# Patient Record
Sex: Female | Born: 1980 | Race: White | Hispanic: No | Smoking: Current some day smoker
Health system: Southern US, Community
[De-identification: ages and names within clinical notes are randomized; demographics above are authoritative.]

## PROBLEM LIST (undated history)

## (undated) ENCOUNTER — Inpatient Hospital Stay (HOSPITAL_COMMUNITY): Payer: Self-pay

## (undated) DIAGNOSIS — I10 Essential (primary) hypertension: Secondary | ICD-10-CM

## (undated) DIAGNOSIS — F419 Anxiety disorder, unspecified: Secondary | ICD-10-CM

## (undated) DIAGNOSIS — F988 Other specified behavioral and emotional disorders with onset usually occurring in childhood and adolescence: Secondary | ICD-10-CM

## (undated) DIAGNOSIS — F32A Depression, unspecified: Secondary | ICD-10-CM

## (undated) DIAGNOSIS — F329 Major depressive disorder, single episode, unspecified: Secondary | ICD-10-CM

## (undated) HISTORY — DX: Anxiety disorder, unspecified: F41.9

## (undated) HISTORY — PX: NO PAST SURGERIES: SHX2092

## (undated) HISTORY — DX: Other specified behavioral and emotional disorders with onset usually occurring in childhood and adolescence: F98.8

---

## 2012-09-04 ENCOUNTER — Encounter (HOSPITAL_COMMUNITY): Payer: Self-pay | Admitting: Emergency Medicine

## 2012-09-04 ENCOUNTER — Emergency Department (INDEPENDENT_AMBULATORY_CARE_PROVIDER_SITE_OTHER)
Admission: EM | Admit: 2012-09-04 | Discharge: 2012-09-04 | Disposition: A | Discharge status: Home / Self Care | Payer: Self-pay | Source: Home / Self Care | Attending: Emergency Medicine | Admitting: Emergency Medicine

## 2012-09-04 DIAGNOSIS — S161XXA Strain of muscle, fascia and tendon at neck level, initial encounter: Secondary | ICD-10-CM

## 2012-09-04 DIAGNOSIS — S139XXA Sprain of joints and ligaments of unspecified parts of neck, initial encounter: Secondary | ICD-10-CM

## 2012-09-04 HISTORY — DX: Essential (primary) hypertension: I10

## 2012-09-04 HISTORY — DX: Other specified behavioral and emotional disorders with onset usually occurring in childhood and adolescence: F98.8

## 2012-09-04 HISTORY — DX: Depression, unspecified: F32.A

## 2012-09-04 HISTORY — DX: Major depressive disorder, single episode, unspecified: F32.9

## 2012-09-04 MED ORDER — IBUPROFEN 800 MG PO TABS: 800.0000 mg | ORAL_TABLET | Freq: Three times a day (TID) | ORAL | Status: DC

## 2012-09-04 MED ORDER — CYCLOBENZAPRINE HCL 5 MG PO TABS: 5.0000 mg | ORAL_TABLET | Freq: Three times a day (TID) | ORAL | Status: DC | PRN

## 2012-09-04 MED ORDER — HYDROCODONE-ACETAMINOPHEN 5-325 MG PO TABS: ORAL_TABLET | ORAL | Status: DC
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
## 2013-01-08 ENCOUNTER — Other Ambulatory Visit: Payer: Self-pay | Admitting: Family Medicine

## 2013-01-08 ENCOUNTER — Other Ambulatory Visit (HOSPITAL_COMMUNITY)
Admission: RE | Admit: 2013-01-08 | Discharge: 2013-01-08 | Disposition: A | Discharge status: Home / Self Care | Payer: Self-pay | Source: Ambulatory Visit | Attending: Family Medicine | Admitting: Family Medicine

## 2013-01-08 DIAGNOSIS — Z0000 Encounter for general adult medical examination without abnormal findings: Secondary | ICD-10-CM | POA: Insufficient documentation

## 2015-06-21 ENCOUNTER — Inpatient Hospital Stay (HOSPITAL_COMMUNITY): Payer: 59

## 2015-06-21 ENCOUNTER — Encounter (HOSPITAL_COMMUNITY): Payer: Self-pay | Admitting: *Deleted

## 2015-06-21 ENCOUNTER — Inpatient Hospital Stay (HOSPITAL_COMMUNITY)
Admission: AD | Admit: 2015-06-21 | Discharge: 2015-06-21 | Disposition: A | Discharge status: Home / Self Care | Payer: 59 | Source: Ambulatory Visit | Attending: Obstetrics & Gynecology | Admitting: Obstetrics & Gynecology

## 2015-06-21 DIAGNOSIS — F329 Major depressive disorder, single episode, unspecified: Secondary | ICD-10-CM | POA: Insufficient documentation

## 2015-06-21 DIAGNOSIS — R109 Unspecified abdominal pain: Secondary | ICD-10-CM | POA: Insufficient documentation

## 2015-06-21 DIAGNOSIS — O9989 Other specified diseases and conditions complicating pregnancy, childbirth and the puerperium: Secondary | ICD-10-CM

## 2015-06-21 DIAGNOSIS — O26891 Other specified pregnancy related conditions, first trimester: Secondary | ICD-10-CM | POA: Insufficient documentation

## 2015-06-21 DIAGNOSIS — O200 Threatened abortion: Secondary | ICD-10-CM | POA: Insufficient documentation

## 2015-06-21 DIAGNOSIS — O99341 Other mental disorders complicating pregnancy, first trimester: Secondary | ICD-10-CM | POA: Diagnosis not present

## 2015-06-21 DIAGNOSIS — Z3A01 Less than 8 weeks gestation of pregnancy: Secondary | ICD-10-CM | POA: Insufficient documentation

## 2015-06-21 DIAGNOSIS — O3680X0 Pregnancy with inconclusive fetal viability, not applicable or unspecified: Secondary | ICD-10-CM

## 2015-06-21 DIAGNOSIS — O10911 Unspecified pre-existing hypertension complicating pregnancy, first trimester: Secondary | ICD-10-CM | POA: Insufficient documentation

## 2015-06-21 DIAGNOSIS — F909 Attention-deficit hyperactivity disorder, unspecified type: Secondary | ICD-10-CM | POA: Insufficient documentation

## 2015-06-21 DIAGNOSIS — O26899 Other specified pregnancy related conditions, unspecified trimester: Secondary | ICD-10-CM

## 2015-06-21 LAB — URINALYSIS, ROUTINE W REFLEX MICROSCOPIC
Bilirubin Urine: NEGATIVE
GLUCOSE, UA: NEGATIVE mg/dL
Ketones, ur: NEGATIVE mg/dL
LEUKOCYTES UA: NEGATIVE
NITRITE: NEGATIVE
PH: 6 (ref 5.0–8.0)
Protein, ur: NEGATIVE mg/dL
SPECIFIC GRAVITY, URINE: 1.02 (ref 1.005–1.030)

## 2015-06-21 LAB — CBC
HEMATOCRIT: 39.8 % (ref 36.0–46.0)
HEMOGLOBIN: 13.4 g/dL (ref 12.0–15.0)
MCH: 32.3 pg (ref 26.0–34.0)
MCHC: 33.7 g/dL (ref 30.0–36.0)
MCV: 95.9 fL (ref 78.0–100.0)
Platelets: 440 10*3/uL — ABNORMAL HIGH (ref 150–400)
RBC: 4.15 MIL/uL (ref 3.87–5.11)
RDW: 13.5 % (ref 11.5–15.5)
WBC: 18.4 10*3/uL — AB (ref 4.0–10.5)

## 2015-06-21 LAB — URINE MICROSCOPIC-ADD ON

## 2015-06-21 LAB — WET PREP, GENITAL
Sperm: NONE SEEN
TRICH WET PREP: NONE SEEN
Yeast Wet Prep HPF POC: NONE SEEN

## 2015-06-21 LAB — ABO/RH: ABO/RH(D): O POS

## 2015-06-21 LAB — HCG, QUANTITATIVE, PREGNANCY: hCG, Beta Chain, Quant, S: 1969 m[IU]/mL — ABNORMAL HIGH (ref <5)

## 2015-06-21 NOTE — Discharge Summary (Signed)
DATE: 06/21/2015  Maternity Admissions Unit DC Summary  Admitted: 06/21/15 Discharged: 06/21/15  Ms. Loretta Brown is a 35 y.o. female, G2P0010, at [redacted]w[redacted]d gestation, who presents for management of a threatened miscarriage. The patient has been followed at [facility]. She has had spotting. She presents today complaining of lower abdominal pain that she rates at 4 out of 10. She has had quantitative hCG values checked in her doctor's office. She was told that the test are not normal. She does not know what the test results are however. She has not had an ultrasound done. She is very upset because she had a miscarriage one year ago.  OB History    Gravida Para Term Preterm AB TAB SAB Ectopic Multiple Living   2    1  1    0      Past Medical History  Diagnosis Date  . Depression   . Attention deficit disorder   . Hypertension     Prescriptions prior to admission  Medication Sig Dispense Refill Last Dose  . amphetamine-dextroamphetamine (ADDERALL) 20 MG tablet Take 20 mg by mouth 2 (two) times daily.   06/20/2015 at Unknown time  . buPROPion (WELLBUTRIN XL) 300 MG 24 hr tablet Take 300 mg by mouth daily.   06/20/2015 at Unknown time  . DULoxetine HCl (CYMBALTA PO) Take 1 tablet by mouth daily.   06/20/2015 at Unknown time  . glycopyrrolate (ROBINUL) 1 MG tablet Take 0.5-1 mg by mouth 2 (two) times daily.   06/20/2015 at Unknown time  . hydrALAZINE (APRESOLINE) 25 MG tablet Take 25 mg by mouth 2 (two) times daily.   06/20/2015 at Unknown time  . hydrochlorothiazide (HYDRODIURIL) 25 MG tablet Take 25 mg by mouth daily.   06/20/2015 at Unknown time  . labetalol (NORMODYNE) 100 MG tablet Take 100 mg by mouth 2 (two) times daily.   06/20/2015 at 8:00am  . cyclobenzaprine (FLEXERIL) 5 MG tablet Take 1 tablet (5 mg total) by mouth 3 (three) times daily as needed for muscle spasms. (Patient not taking: Reported on  06/21/2015) 30 tablet 0   . HYDROcodone-acetaminophen (NORCO/VICODIN) 5-325 MG per tablet 1 to 2 tabs every 4 to 6 hours as needed for pain. (Patient not taking: Reported on 06/21/2015) 20 tablet 0   . ibuprofen (ADVIL,MOTRIN) 800 MG tablet Take 1 tablet (800 mg total) by mouth 3 (three) times daily. (Patient not taking: Reported on 06/21/2015) 21 tablet 0     Past Surgical History  Procedure Laterality Date  . No past surgeries      No Known Allergies  Family History: family history is not on file.  Social History:  reports that she has been smoking. She does not have any smokeless tobacco history on file. She reports that she drinks alcohol. She reports that she uses illicit drugs (Marijuana).  Review of systems: Normal pregnancy complaints.  Admission Physical Exam:    Body mass index is 33.56 kg/(m^2).  Blood pressure 146/104, pulse 69, temperature 98 F (36.7 C), temperature source Oral, resp. rate 18, height 5\' 4"  (1.626 m), weight 195 lb 9.6 oz (88.724 kg), last menstrual period 06/01/2015.  HEENT: Within normal limits Chest: Clear Heart: Regular rate and rhythm Abdomen: Gravid and nontender Extremities: Grossly normal Neurologic exam: Grossly normal Pelvic exam:   External genitalia: Within normal limits Vagina: Within normal limits. No blood. Cervix: Closed. Nontender Uterus: Normal size, nontender Adnexa: No masses  Prenatal labs: ABO, Rh: --/--/O POS (03/26 [id number])  Assessment:  [redacted]w[redacted]d  gestation  Threatened miscarriage  Abdominal pain  Hypertension  Depression  ADHD  Plan:  Quantitative hCG sent today.  Ultrasound ordered.  GC and Chlamydia sent.  We discussed the management of a failed pregnancy. The patient will remain nothing by mouth for now. Further management will depend upon her test results.   Lab Results Last 24 Hours          MAU Course:  The patient was able to get HCG values from her medical records:  06/16/15 [facility] 4231  06/18/15  QHCG 3519  US FINDINGS:  Intrauterine gestational sac: Irregular sac-like density within the endometrial canal measuring approximately 1 cm.  Yolk sac: Not identified  Embryo: Not identified  MSD: 10 mm 5 w 5 d  Maternal uterus/adnexae: The RIGHT ovary is identified and normal in size and echogenicity. The LEFT ovary is less well-defined. LEFT ovary is not hyperemic. There is moderate volume complex fluid in the LEFT adnexa as well as posterior cul-de-sac fine with fine shading suggesting small amount of blood within the fluid.  IMPRESSION: Irregular sac-like structure within the endometrium with moderate volume complex fluid posterior in cul-de-sac and LEFT adnexal fluid. Differential consideration would be spontaneous abortion in complex versus occult ectopic pregnancy. Early pregnancy is less favored but cannot be excluded.  CBC    Component Value Date/Time   WBC 18.4* 06/21/2015 0914   RBC 4.15 06/21/2015 0914   HGB 13.4 06/21/2015 0914   HCT 39.8 06/21/2015 0914   PLT 440* 06/21/2015 0914   MCV 95.9 06/21/2015 0914   MCH 32.3 06/21/2015 0914   MCHC 33.7 06/21/2015 0914   RDW 13.5 06/21/2015 0914    QHCG = 1969  Management options reviewed including observation with repeat US and HCGs, medical management, and D and C. Risks and benefits discussed. The patient and her husband want to go home and discuss with Dr. Nelda Marseille tomorrow. Ectopic precautions were given.     Michiah Mudry V 06/21/2015, 11:43 AM

## 2015-06-21 NOTE — MAU Note (Signed)
Pos HPT last week, had BHCG last Tuesday & Thursday @ MD office, BHCG not rising appropriately, was told she may have possible ectopic.  Spotting for the past 2 days, started having abdominal pain @ 0700 today, is getting progressively worse.

## 2015-06-21 NOTE — MAU Provider Note (Signed)
History     CSN: [id number]  Arrival date and time: 06/21/15 [id number]    First Provider Initiated Contact with Patient 06/21/15 [phone]      Chief Complaint  Patient presents with  . Abdominal Pain   HPI  Loretta Brown is a 35 y.o. G3P0011 at [redacted]w[redacted]d who presents by LMP who presents for abdominal pain & vaginal spotting. BHCG being followed by Dr. Nelda Marseille. Had labs drawn twice last week & told they weren't rising appropriately; doesn't know what the levels were & has not had an ultrasound yet.  Reports increased lower abdominal cramping this morning. Vaginal spotting off & on.  Abdominal pain is menstrual-like cramps. Rates pain 8/10.   OB History    Gravida Para Term Preterm AB TAB SAB Ectopic Multiple Living   1               Past Medical History  Diagnosis Date  . Depression   . Attention deficit disorder   . Hypertension     No past surgical history on file.  No family history on file.  Social History  Substance Use Topics  . Smoking status: Current Every Day Smoker  . Smokeless tobacco: Not on file  . Alcohol Use: Yes    Allergies: No Known Allergies  Prescriptions prior to admission  Medication Sig Dispense Refill Last Dose  . amphetamine-dextroamphetamine (ADDERALL XR) 30 MG 24 hr capsule Take 30 mg by mouth every morning.     Marland Kitchen buPROPion (WELLBUTRIN SR) 150 MG 12 hr tablet Take 150 mg by mouth 2 (two) times daily.     . cyclobenzaprine (FLEXERIL) 5 MG tablet Take 1 tablet (5 mg total) by mouth 3 (three) times daily as needed for muscle spasms. 30 tablet 0   . HYDROcodone-acetaminophen (NORCO/VICODIN) 5-325 MG per tablet 1 to 2 tabs every 4 to 6 hours as needed for pain. 20 tablet 0   . ibuprofen (ADVIL,MOTRIN) 800 MG tablet Take 1 tablet (800 mg total) by mouth 3 (three) times daily. 21 tablet 0   . sertraline (ZOLOFT) 50 MG tablet Take 50 mg by mouth daily.       Review of Systems  Constitutional: Negative.   Gastrointestinal: Positive for abdominal pain.  Negative for nausea, vomiting, diarrhea and constipation.  Genitourinary: Negative for dysuria.       + vaginal spotting   Physical Exam   Blood pressure 146/104, pulse 69, temperature 98 F (36.7 C), temperature source Oral, resp. rate 18, height 5\' 4"  (1.626 m), weight 195 lb 9.6 oz (88.724 kg), last menstrual period 06/01/2015.  Physical Exam  Nursing note and vitals reviewed. Constitutional: She is oriented to person, place, and time. She appears well-developed and well-nourished. No distress.  HENT:  Head: Normocephalic and atraumatic.  Eyes: Conjunctivae are normal. Right eye exhibits no discharge. Left eye exhibits no discharge. No scleral icterus.  Neck: Normal range of motion.  Cardiovascular:  No murmur heard. Respiratory: Effort normal. No respiratory distress.  Neurological: She is alert and oriented to person, place, and time.  Skin: Skin is warm and dry. She is not diaphoretic.  Psychiatric: She has a normal mood and affect. Her behavior is normal. Judgment and thought content normal.    MAU Course  Procedures Results for orders placed or performed during the hospital encounter of 06/21/15 (from the past 24 hour(s))  hCG, quantitative, pregnancy     Status: Abnormal   Collection Time: 06/21/15  8:42 AM  Result Value Ref Range  hCG, Beta Chain, Quant, S 1969 (H) <5 mIU/mL  CBC     Status: Abnormal   Collection Time: 06/21/15  9:14 AM  Result Value Ref Range   WBC 18.4 (H) 4.0 - 10.5 K/uL   RBC 4.15 3.87 - 5.11 MIL/uL   Hemoglobin 13.4 12.0 - 15.0 g/dL   HCT 39.8 36.0 - 46.0 %   MCV 95.9 78.0 - 100.0 fL   MCH 32.3 26.0 - 34.0 pg   MCHC 33.7 30.0 - 36.0 g/dL   RDW 13.5 11.5 - 15.5 %   Platelets 440 (H) 150 - 400 K/uL  ABO/Rh     Status: None (Preliminary result)   Collection Time: 06/21/15  9:14 AM  Result Value Ref Range   ABO/RH(D) O POS   Urinalysis, Routine w reflex microscopic (not at [facility])     Status: Abnormal   Collection Time: 06/21/15  9:15 AM   Result Value Ref Range   Color, Urine YELLOW YELLOW   APPearance CLEAR CLEAR   Specific Gravity, Urine 1.020 1.005 - 1.030   pH 6.0 5.0 - 8.0   Glucose, UA NEGATIVE NEGATIVE mg/dL   Hgb urine dipstick MODERATE (A) NEGATIVE   Bilirubin Urine NEGATIVE NEGATIVE   Ketones, ur NEGATIVE NEGATIVE mg/dL   Protein, ur NEGATIVE NEGATIVE mg/dL   Nitrite NEGATIVE NEGATIVE   Leukocytes, UA NEGATIVE NEGATIVE  Urine microscopic-add on     Status: Abnormal   Collection Time: 06/21/15  9:15 AM  Result Value Ref Range   Squamous Epithelial / LPF 0-5 (A) NONE SEEN   WBC, UA 0-5 0 - 5 WBC/hpf   RBC / HPF 0-5 0 - 5 RBC/hpf   Bacteria, UA FEW (A) NONE SEEN  Wet prep, genital     Status: Abnormal   Collection Time: 06/21/15  9:54 AM  Result Value Ref Range   Yeast Wet Prep HPF POC NONE SEEN NONE SEEN   Trich, Wet Prep NONE SEEN NONE SEEN   Clue Cells Wet Prep HPF POC PRESENT (A) NONE SEEN   WBC, Wet Prep HPF POC FEW (A) NONE SEEN   Sperm NONE SEEN    US Ob Comp Less 14 Wks  06/21/2015  CLINICAL DATA:  Estimated gestational age by last menstrual. Approximately 5 to 6 weeks. Positive pregnancy test with beta HCG equal 1969. EXAM: OBSTETRIC <14 WK [place] AND TRANSVAGINAL OB US TECHNIQUE: Both transabdominal and transvaginal ultrasound examinations were performed for complete evaluation of the gestation as well as the maternal uterus, adnexal regions, and pelvic cul-de-sac. Transvaginal technique was performed to assess early pregnancy. COMPARISON:  None. FINDINGS: Intrauterine gestational sac: Irregular sac-like density within the endometrial canal measuring approximately 1 cm. Yolk sac:  Not identified Embryo:  Not identified MSD: 10  mm   5 w   5  d Maternal uterus/adnexae: The RIGHT ovary is identified and normal in size and echogenicity. The LEFT ovary is less well-defined. LEFT ovary is not hyperemic. There is moderate volume complex fluid in the LEFT adnexa as well as posterior cul-de-sac fine with fine  shading suggesting small amount of blood within the fluid. IMPRESSION: Irregular sac-like structure within the endometrium with moderate volume complex fluid posterior in cul-de-sac and LEFT adnexal fluid. Differential consideration would be spontaneous abortion in complex versus occult ectopic pregnancy. Early pregnancy is less favored but cannot be excluded. Findings conveyed toERIN Briauna Gilmartin on 06/21/2015  at11:10. Electronically Signed   By: Suzy Bouchard M.D.   On: 06/21/2015 11:12   US  Ob Transvaginal  06/21/2015  CLINICAL DATA:  Estimated gestational age by last menstrual. Approximately 5 to 6 weeks. Positive pregnancy test with beta HCG equal 1969. EXAM: OBSTETRIC <14 WK [place] AND TRANSVAGINAL OB US TECHNIQUE: Both transabdominal and transvaginal ultrasound examinations were performed for complete evaluation of the gestation as well as the maternal uterus, adnexal regions, and pelvic cul-de-sac. Transvaginal technique was performed to assess early pregnancy. COMPARISON:  None. FINDINGS: Intrauterine gestational sac: Irregular sac-like density within the endometrial canal measuring approximately 1 cm. Yolk sac:  Not identified Embryo:  Not identified MSD: 10  mm   5 w   5  d Maternal uterus/adnexae: The RIGHT ovary is identified and normal in size and echogenicity. The LEFT ovary is less well-defined. LEFT ovary is not hyperemic. There is moderate volume complex fluid in the LEFT adnexa as well as posterior cul-de-sac fine with fine shading suggesting small amount of blood within the fluid. IMPRESSION: Irregular sac-like structure within the endometrium with moderate volume complex fluid posterior in cul-de-sac and LEFT adnexal fluid. Differential consideration would be spontaneous abortion in complex versus occult ectopic pregnancy. Early pregnancy is less favored but cannot be excluded. Findings conveyed toERIN Gwendalyn Mcgonagle on 06/21/2015  at11:10. Electronically Signed   By: Suzy Bouchard M.D.   On:  06/21/2015 11:12    MDM CBC, abo/rh, BHCG, ultrasound O positive Dr. Raphael Gibney at bedside for pelvic exam.  Discussed ultrasound report with Dr. Raphael Gibney. Will come speak with patient regarding results & POC.   Jorje Guild, NP  06/21/2015 11:44 AM  Assessment and Plan

## 2015-06-21 NOTE — MAU Note (Signed)
DATE: 06/21/2015  Maternity Admissions Unit History and Physical Exam for an Obstetrics Patient  Ms. Loretta Brown is a 35 y.o. female, G2P0010, at [redacted]w[redacted]d gestation, who presents for management of a threatened miscarriage. The patient has been followed at [facility]. She has had spotting. She presents today complaining of lower abdominal pain that she rates at 4 out of 10. She has had quantitative hCG values checked in her doctor's office. She was told that the test are not normal. She does not know what the test results are however. She has not had an ultrasound done. She is very upset because she had a miscarriage one year ago.  OB History    Gravida Para Term Preterm AB TAB SAB Ectopic Multiple Living   2    1  1    0      Past Medical History  Diagnosis Date  . Depression   . Attention deficit disorder   . Hypertension     Prescriptions prior to admission  Medication Sig Dispense Refill Last Dose  . amphetamine-dextroamphetamine (ADDERALL) 20 MG tablet Take 20 mg by mouth 2 (two) times daily.   06/20/2015 at Unknown time  . buPROPion (WELLBUTRIN XL) 300 MG 24 hr tablet Take 300 mg by mouth daily.   06/20/2015 at Unknown time  . DULoxetine HCl (CYMBALTA PO) Take 1 tablet by mouth daily.   06/20/2015 at Unknown time  . glycopyrrolate (ROBINUL) 1 MG tablet Take 0.5-1 mg by mouth 2 (two) times daily.   06/20/2015 at Unknown time  . hydrALAZINE (APRESOLINE) 25 MG tablet Take 25 mg by mouth 2 (two) times daily.   06/20/2015 at Unknown time  . hydrochlorothiazide (HYDRODIURIL) 25 MG tablet Take 25 mg by mouth daily.   06/20/2015 at Unknown time  . labetalol (NORMODYNE) 100 MG tablet Take 100 mg by mouth 2 (two) times daily.   06/20/2015 at 8:00am  . cyclobenzaprine (FLEXERIL) 5 MG tablet Take 1 tablet (5 mg total) by mouth 3 (three) times daily as needed for muscle spasms. (Patient not taking: Reported on 06/21/2015) 30 tablet 0   . HYDROcodone-acetaminophen (NORCO/VICODIN) 5-325 MG per tablet 1 to 2  tabs every 4 to 6 hours as needed for pain. (Patient not taking: Reported on 06/21/2015) 20 tablet 0   . ibuprofen (ADVIL,MOTRIN) 800 MG tablet Take 1 tablet (800 mg total) by mouth 3 (three) times daily. (Patient not taking: Reported on 06/21/2015) 21 tablet 0     Past Surgical History  Procedure Laterality Date  . No past surgeries      No Known Allergies  Family History: family history is not on file.  Social History:  reports that she has been smoking.  She does not have any smokeless tobacco history on file. She reports that she drinks alcohol. She reports that she uses illicit drugs (Marijuana).  Review of systems: Normal pregnancy complaints.  Admission Physical Exam:    Body mass index is 33.56 kg/(m^2).  Blood pressure 146/104, pulse 69, temperature 98 F (36.7 C), temperature source Oral, resp. rate 18, height 5\' 4"  (1.626 m), weight 195 lb 9.6 oz (88.724 kg), last menstrual period 06/01/2015.  HEENT:                 Within normal limits Chest:                   Clear Heart:  Regular rate and rhythm Abdomen:             Gravid and nontender Extremities:          Grossly normal Neurologic exam: Grossly normal Pelvic exam:           External genitalia: Within normal limits Vagina: Within normal limits. No blood. Cervix: Closed. Nontender Uterus: Normal size, nontender Adnexa: No masses  Prenatal labs: ABO, Rh:             --/--/O POS (03/26 [id number])  Assessment:  [redacted]w[redacted]d gestation  Threatened miscarriage  Abdominal pain  Hypertension  Depression  ADHD  Plan:  Quantitative hCG sent today.  Ultrasound ordered.  GC and Chlamydia sent.  We discussed the management of a failed pregnancy. The patient will remain nothing by mouth for now. Further management will depend upon her test results.  Results for orders placed or performed during the hospital encounter of 06/21/15 (from the past 24 hour(s))  hCG, quantitative, pregnancy     Status:  Abnormal   Collection Time: 06/21/15  8:42 AM  Result Value Ref Range   hCG, Beta Chain, Quant, S 1969 (H) <5 mIU/mL  CBC     Status: Abnormal   Collection Time: 06/21/15  9:14 AM  Result Value Ref Range   WBC 18.4 (H) 4.0 - 10.5 K/uL   RBC 4.15 3.87 - 5.11 MIL/uL   Hemoglobin 13.4 12.0 - 15.0 g/dL   HCT 39.8 36.0 - 46.0 %   MCV 95.9 78.0 - 100.0 fL   MCH 32.3 26.0 - 34.0 pg   MCHC 33.7 30.0 - 36.0 g/dL   RDW 13.5 11.5 - 15.5 %   Platelets 440 (H) 150 - 400 K/uL  ABO/Rh     Status: None (Preliminary result)   Collection Time: 06/21/15  9:14 AM  Result Value Ref Range   ABO/RH(D) O POS   Urinalysis, Routine w reflex microscopic (not at [facility])     Status: Abnormal   Collection Time: 06/21/15  9:15 AM  Result Value Ref Range   Color, Urine YELLOW YELLOW   APPearance CLEAR CLEAR   Specific Gravity, Urine 1.020 1.005 - 1.030   pH 6.0 5.0 - 8.0   Glucose, UA NEGATIVE NEGATIVE mg/dL   Hgb urine dipstick MODERATE (A) NEGATIVE   Bilirubin Urine NEGATIVE NEGATIVE   Ketones, ur NEGATIVE NEGATIVE mg/dL   Protein, ur NEGATIVE NEGATIVE mg/dL   Nitrite NEGATIVE NEGATIVE   Leukocytes, UA NEGATIVE NEGATIVE  Urine microscopic-add on     Status: Abnormal   Collection Time: 06/21/15  9:15 AM  Result Value Ref Range   Squamous Epithelial / LPF 0-5 (A) NONE SEEN   WBC, UA 0-5 0 - 5 WBC/hpf   RBC / HPF 0-5 0 - 5 RBC/hpf   Bacteria, UA FEW (A) NONE SEEN  Wet prep, genital     Status: Abnormal   Collection Time: 06/21/15  9:54 AM  Result Value Ref Range   Yeast Wet Prep HPF POC NONE SEEN NONE SEEN   Trich, Wet Prep NONE SEEN NONE SEEN   Clue Cells Wet Prep HPF POC PRESENT (A) NONE SEEN   WBC, Wet Prep HPF POC FEW (A) NONE SEEN   Sperm NONE SEEN       Loretta Brown V 06/21/2015, 10:23 AM

## 2015-06-21 NOTE — Discharge Instructions (Signed)
Incomplete Miscarriage A miscarriage is the sudden loss of an unborn baby (fetus) before the 20th week of pregnancy. In an incomplete miscarriage, parts of the fetus or placenta (afterbirth) remain in the body.  Having a miscarriage can be an emotional experience. Talk with your health care provider about any questions you may have about miscarrying, the grieving process, and your future pregnancy plans. CAUSES   Problems with the fetal chromosomes that make it impossible for the baby to develop normally. Problems with the baby's genes or chromosomes are most often the result of errors that occur by chance as the embryo divides and grows. The problems are not inherited from the parents.  Infection of the cervix or uterus.  Hormone problems.  Problems with the cervix, such as having an incompetent cervix. This is when the tissue in the cervix is not strong enough to hold the pregnancy.  Problems with the uterus, such as an abnormally shaped uterus, uterine fibroids, or congenital abnormalities.  Certain medical conditions.  Smoking, drinking alcohol, or taking illegal drugs.  Trauma. SYMPTOMS   Vaginal bleeding or spotting, with or without cramps or pain.  Pain or cramping in the abdomen or lower back.  Passing fluid, tissue, or blood clots from the vagina. DIAGNOSIS  Your health care provider will perform a physical exam. You may also have an ultrasound to confirm the miscarriage. Blood or urine tests may also be ordered. TREATMENT   Usually, a dilation and curettage (D&C) procedure is performed. During a D&C procedure, the cervix is widened (dilated) and any remaining fetal or placental tissue is gently removed from the uterus.  Antibiotic medicines are prescribed if there is an infection. Other medicines may be given to reduce the size of the uterus (contract) if there is a lot of bleeding.  If you have Rh negative blood and your baby was Rh positive, you will need a Rho (D)  immune globulin shot. This shot will protect any future baby from having Rh blood problems in future pregnancies.  You may be confined to bed rest. This means you should stay in bed and only get up to use the bathroom. HOME CARE INSTRUCTIONS   Rest as directed by your health care provider.  Restrict activity as directed by your health care provider. You may be allowed to continue light activity if curettage was not done but you require further treatment.  Keep track of the number of pads you use each day. Keep track of how soaked (saturated) they are. Record this information.  Do not  use tampons.  Do not douche or have sexual intercourse until approved by your health care provider.  Keep all follow-up appointments for reevaluation and continuing management.  Only take over-the-counter or prescription medicines for pain, fever, or discomfort as directed by your health care provider.  Take antibiotic medicine as directed by your health care provider. Make sure you finish it even if you start to feel better. SEEK IMMEDIATE MEDICAL CARE IF:   You experience severe cramps in your stomach, back, or abdomen.  You have an unexplained temperature (make sure to record these temperatures).  You pass large clots or tissue (save these for your health care provider to inspect).  Your bleeding increases.  You become light-headed, weak, or have fainting episodes. MAKE SURE YOU:   Understand these instructions.  Will watch your condition.  Will get help right away if you are not doing well or get worse.   This information is not intended to   replace advice given to you by your health care provider. Make sure you discuss any questions you have with your health care provider.   Document Released: 03/14/2005 Document Revised: 04/04/2014 Document Reviewed: 10/11/2012 Elsevier Interactive Patient Education 2016 Elsevier Inc.  

## 2015-06-22 ENCOUNTER — Inpatient Hospital Stay (HOSPITAL_COMMUNITY)
Admission: AD | Admit: 2015-06-22 | Discharge: 2015-06-22 | Disposition: A | Discharge status: Home / Self Care | Payer: 59 | Source: Ambulatory Visit | Attending: Obstetrics and Gynecology | Admitting: Obstetrics and Gynecology

## 2015-06-22 DIAGNOSIS — N939 Abnormal uterine and vaginal bleeding, unspecified: Secondary | ICD-10-CM | POA: Diagnosis present

## 2015-06-22 DIAGNOSIS — F17200 Nicotine dependence, unspecified, uncomplicated: Secondary | ICD-10-CM | POA: Insufficient documentation

## 2015-06-22 DIAGNOSIS — O039 Complete or unspecified spontaneous abortion without complication: Secondary | ICD-10-CM | POA: Insufficient documentation

## 2015-06-22 DIAGNOSIS — R109 Unspecified abdominal pain: Secondary | ICD-10-CM | POA: Diagnosis present

## 2015-06-22 LAB — CBC
HEMATOCRIT: 36.3 % (ref 36.0–46.0)
Hemoglobin: 12.5 g/dL (ref 12.0–15.0)
MCH: 31.9 pg (ref 26.0–34.0)
MCHC: 34.4 g/dL (ref 30.0–36.0)
MCV: 92.6 fL (ref 78.0–100.0)
Platelets: 441 10*3/uL — ABNORMAL HIGH (ref 150–400)
RBC: 3.92 MIL/uL (ref 3.87–5.11)
RDW: 13.6 % (ref 11.5–15.5)
WBC: 19.4 10*3/uL — AB (ref 4.0–10.5)

## 2015-06-22 LAB — GC/CHLAMYDIA PROBE AMP (~~LOC~~) NOT AT ARMC
CHLAMYDIA, DNA PROBE: NEGATIVE
Neisseria Gonorrhea: NEGATIVE

## 2015-06-22 MED ORDER — HYDROMORPHONE HCL 1 MG/ML IJ SOLN
1.0000 mg | Freq: Once | INTRAMUSCULAR | Status: AC
  Administered 2015-06-22: 1 mg via INTRAVENOUS
  Filled 2015-06-22: qty 1

## 2015-06-22 MED ORDER — ONDANSETRON 8 MG PO TBDP: 8.0000 mg | ORAL_TABLET | Freq: Once | ORAL | Status: DC

## 2015-06-22 MED ORDER — HYDROCODONE-ACETAMINOPHEN 5-325 MG PO TABS: 1.0000 | ORAL_TABLET | ORAL | Status: DC | PRN

## 2015-06-22 MED ORDER — LACTATED RINGERS IV SOLN
INTRAVENOUS | Status: DC
  Administered 2015-06-22: 04:00:00 via INTRAVENOUS

## 2015-06-22 MED ORDER — IBUPROFEN 800 MG PO TABS
800.0000 mg | ORAL_TABLET | Freq: Once | ORAL | Status: AC
  Administered 2015-06-22: 800 mg via ORAL
  Filled 2015-06-22: qty 1

## 2015-06-22 NOTE — MAU Provider Note (Signed)
History     CSN: [id number]  Arrival date and time: 06/22/15 [id number]   First Provider Initiated Contact with Patient 06/22/15 [phone]      Chief Complaint  Patient presents with  . Abdominal Cramping  . Vaginal Bleeding   HPI Comments: Loretta Brown is a 35 y.o. [phone] at [redacted]w[redacted]d who presents today with abdominal pain and vaginal bleeding. She was seen here yesterday, and dx with SAB. She was to follow up with OB later today. She states that just prior to midnight she had some cramping, and she took tylenol and then went to bed. She states that she woke up a little later, and the pain was worse and the bleeding had increased. She states that she got in the shower, and then decided to come in for eval.   Pt has CHTN, and is on hydralazine 25mg  BID, Labetalol 100mg  BID and HCTZ 25mg  daily. She states that she did not take any of her medications yesterday.  Vaginal Bleeding The patient's primary symptoms include pelvic pain and vaginal bleeding. This is a new problem. The current episode started yesterday. The problem occurs intermittently. The problem has been gradually worsening. The pain is severe. The problem affects both sides. She is pregnant. Associated symptoms include abdominal pain. Pertinent negatives include no chills, constipation, diarrhea, dysuria, fever, frequency, nausea or urgency. The vaginal discharge was bloody. The vaginal bleeding is heavier than menses. She has not been passing clots. She has not been passing tissue. Nothing aggravates the symptoms. She has tried acetaminophen for the symptoms. The treatment provided no relief. Menstrual history: LMP 06/01/15    Past Medical History  Diagnosis Date  . Depression   . Attention deficit disorder   . Hypertension     Past Surgical History  Procedure Laterality Date  . No past surgeries      No family history on file.  Social History  Substance Use Topics  . Smoking status: Current Every Day Smoker  . Smokeless tobacco:  Not on file  . Alcohol Use: Yes    Allergies: No Known Allergies  Prescriptions prior to admission  Medication Sig Dispense Refill Last Dose  . amphetamine-dextroamphetamine (ADDERALL) 20 MG tablet Take 20 mg by mouth 2 (two) times daily.   06/20/2015 at Unknown time  . buPROPion (WELLBUTRIN XL) 300 MG 24 hr tablet Take 300 mg by mouth daily.   06/20/2015 at Unknown time  . cyclobenzaprine (FLEXERIL) 5 MG tablet Take 1 tablet (5 mg total) by mouth 3 (three) times daily as needed for muscle spasms. (Patient not taking: Reported on 06/21/2015) 30 tablet 0   . DULoxetine HCl (CYMBALTA PO) Take 1 tablet by mouth daily.   06/20/2015 at Unknown time  . glycopyrrolate (ROBINUL) 1 MG tablet Take 0.5-1 mg by mouth 2 (two) times daily.   06/20/2015 at Unknown time  . hydrALAZINE (APRESOLINE) 25 MG tablet Take 25 mg by mouth 2 (two) times daily.   06/20/2015 at Unknown time  . hydrochlorothiazide (HYDRODIURIL) 25 MG tablet Take 25 mg by mouth daily.   06/20/2015 at Unknown time  . HYDROcodone-acetaminophen (NORCO/VICODIN) 5-325 MG per tablet 1 to 2 tabs every 4 to 6 hours as needed for pain. (Patient not taking: Reported on 06/21/2015) 20 tablet 0   . ibuprofen (ADVIL,MOTRIN) 800 MG tablet Take 1 tablet (800 mg total) by mouth 3 (three) times daily. (Patient not taking: Reported on 06/21/2015) 21 tablet 0   . labetalol (NORMODYNE) 100 MG tablet Take 100 mg by  mouth 2 (two) times daily.   06/20/2015 at 8:00am    Review of Systems  Constitutional: Negative for fever and chills.  Gastrointestinal: Positive for abdominal pain. Negative for nausea, diarrhea and constipation.  Genitourinary: Positive for vaginal bleeding and pelvic pain. Negative for dysuria, urgency and frequency.   Physical Exam   Blood pressure 159/122, pulse 112, temperature 97.6 F (36.4 C), temperature source Oral, resp. rate 22, last menstrual period 06/01/2015.  Physical Exam  Nursing note and vitals reviewed. Constitutional: She is  oriented to person, place, and time. She appears well-developed and well-nourished. No distress.  HENT:  Head: Normocephalic.  Cardiovascular: Normal rate.   Respiratory: Effort normal.  GI: Soft. There is no tenderness. There is no rebound.  Genitourinary:   External: no lesion Vagina: small amount of blood, and two small dime sized clots  Cervix: pink, smooth, no CMT,  Uterus: NSSC Adnexa: NT   Neurological: She is alert and oriented to person, place, and time.  Skin: Skin is warm and dry.  Psychiatric: She has a normal mood and affect.   Results for orders placed or performed during the hospital encounter of 06/22/15 (from the past 24 hour(s))  CBC     Status: Abnormal   Collection Time: 06/22/15  3:50 AM  Result Value Ref Range   WBC 19.4 (H) 4.0 - 10.5 K/uL   RBC 3.92 3.87 - 5.11 MIL/uL   Hemoglobin 12.5 12.0 - 15.0 g/dL   HCT 36.3 36.0 - 46.0 %   MCV 92.6 78.0 - 100.0 fL   MCH 31.9 26.0 - 34.0 pg   MCHC 34.4 30.0 - 36.0 g/dL   RDW 13.6 11.5 - 15.5 %   Platelets 441 (H) 150 - 400 K/uL     MAU Course  Procedures  MDM 0415: D/W Dr. Raphael Gibney, he states all options that were discussed with the patient earlier are available still.  D/W the patient all the options again. Reviewed R/B. She would like some time to think and discuss with her husband.  [id number]: Patient states that she would like to be dc home with pain medication and FU with Dr. Nelda Marseille.  [id number]: D/W Dr. Raphael Gibney, patient can be dc home with 5 Vicodin and FU with Dr. Nelda Marseille later today.   Assessment and Plan   1. SAB (spontaneous abortion)    DC home Comfort measures reviewed  1st Trimester precautions  Bleeding precautions RX: Vicodin 1-2 q 4H PRN #5  Return to MAU as needed FU with OB as planned  Follow-up Information    Follow up with Janyth Pupa, M, DO.   Specialty:  Obstetrics and Gynecology   Why:  today    Contact information:   [id number] [place] [id number] [phone]          Mathis Bud 06/22/2015, 3:49 AM

## 2015-06-22 NOTE — Discharge Instructions (Signed)
Miscarriage  A miscarriage is the sudden loss of an unborn baby (fetus) before the 20th week of pregnancy. Most miscarriages happen in the first 3 months of pregnancy. Sometimes, it happens before a woman even knows she is pregnant. A miscarriage is also called a "spontaneous miscarriage" or "early pregnancy loss." Having a miscarriage can be an emotional experience. Talk with your caregiver about any questions you may have about miscarrying, the grieving process, and your future pregnancy plans.  CAUSES    Problems with the fetal chromosomes that make it impossible for the baby to develop normally. Problems with the baby's genes or chromosomes are most often the result of errors that occur, by chance, as the embryo divides and grows. The problems are not inherited from the parents.   Infection of the cervix or uterus.    Hormone problems.    Problems with the cervix, such as having an incompetent cervix. This is when the tissue in the cervix is not strong enough to hold the pregnancy.    Problems with the uterus, such as an abnormally shaped uterus, uterine fibroids, or congenital abnormalities.    Certain medical conditions.    Smoking, drinking alcohol, or taking illegal drugs.    Trauma.   Often, the cause of a miscarriage is unknown.   SYMPTOMS    Vaginal bleeding or spotting, with or without cramps or pain.   Pain or cramping in the abdomen or lower back.   Passing fluid, tissue, or blood clots from the vagina.  DIAGNOSIS   Your caregiver will perform a physical exam. You may also have an ultrasound to confirm the miscarriage. Blood or urine tests may also be ordered.  TREATMENT    Sometimes, treatment is not necessary if you naturally pass all the fetal tissue that was in the uterus. If some of the fetus or placenta remains in the body (incomplete miscarriage), tissue left behind may become infected and must be removed. Usually, a dilation and curettage (D and C) procedure is performed.  During a D and C procedure, the cervix is widened (dilated) and any remaining fetal or placental tissue is gently removed from the uterus.   Antibiotic medicines are prescribed if there is an infection. Other medicines may be given to reduce the size of the uterus (contract) if there is a lot of bleeding.   If you have Rh negative blood and your baby was Rh positive, you will need a Rh immunoglobulin shot. This shot will protect any future baby from having Rh blood problems in future pregnancies.  HOME CARE INSTRUCTIONS    Your caregiver may order bed rest or may allow you to continue light activity. Resume activity as directed by your caregiver.   Have someone help with home and family responsibilities during this time.    Keep track of the number of sanitary pads you use each day and how soaked (saturated) they are. Write down this information.    Do not use tampons. Do not douche or have sexual intercourse until approved by your caregiver.    Only take over-the-counter or prescription medicines for pain or discomfort as directed by your caregiver.    Do not take aspirin. Aspirin can cause bleeding.    Keep all follow-up appointments with your caregiver.    If you or your partner have problems with grieving, talk to your caregiver or seek counseling to help cope with the pregnancy loss. Allow enough time to grieve before trying to get pregnant again.     SEEK IMMEDIATE MEDICAL CARE IF:    You have severe cramps or pain in your back or abdomen.   You have a fever.   You pass large blood clots (walnut-sized or larger) ortissue from your vagina. Save any tissue for your caregiver to inspect.    Your bleeding increases.    You have a thick, bad-smelling vaginal discharge.   You become lightheaded, weak, or you faint.    You have chills.   MAKE SURE YOU:   Understand these instructions.   Will watch your condition.   Will get help right away if you are not doing well or get worse.     This  information is not intended to replace advice given to you by your health care provider. Make sure you discuss any questions you have with your health care provider.     Document Released: 09/07/2000 Document Revised: 07/09/2012 Document Reviewed: 05/03/2011  Elsevier Interactive Patient Education 2016 [employer]vier Inc.

## 2015-06-22 NOTE — MAU Note (Signed)
Pt presents complaining of vaginal bleeding and cramping that got worse tonight. Known SAB and was supposed to follow up today with Dr. Nelda Marseille for management.

## 2015-06-23 ENCOUNTER — Inpatient Hospital Stay (HOSPITAL_COMMUNITY): Payer: 59 | Admitting: Anesthesiology

## 2015-06-23 ENCOUNTER — Ambulatory Visit (HOSPITAL_COMMUNITY)
Admission: RE | Admit: 2015-06-23 | Discharge: 2015-06-23 | Disposition: A | Discharge status: Home / Self Care | Payer: 59 | Source: Ambulatory Visit | Attending: Obstetrics & Gynecology | Admitting: Obstetrics & Gynecology

## 2015-06-23 ENCOUNTER — Encounter (HOSPITAL_COMMUNITY)
Admission: AD | Disposition: A | Discharge status: Home / Self Care | Payer: Self-pay | Source: Ambulatory Visit | Attending: Obstetrics & Gynecology

## 2015-06-23 ENCOUNTER — Encounter (HOSPITAL_COMMUNITY): Payer: Self-pay | Admitting: Certified Registered Nurse Anesthetist

## 2015-06-23 ENCOUNTER — Other Ambulatory Visit (HOSPITAL_COMMUNITY): Payer: Self-pay | Admitting: Obstetrics & Gynecology

## 2015-06-23 ENCOUNTER — Ambulatory Visit (HOSPITAL_COMMUNITY)
Admission: AD | Admit: 2015-06-23 | Discharge: 2015-06-23 | Disposition: A | Discharge status: Home / Self Care | Payer: 59 | Source: Ambulatory Visit | Attending: Obstetrics & Gynecology | Admitting: Obstetrics & Gynecology

## 2015-06-23 DIAGNOSIS — O034 Incomplete spontaneous abortion without complication: Secondary | ICD-10-CM

## 2015-06-23 DIAGNOSIS — F17210 Nicotine dependence, cigarettes, uncomplicated: Secondary | ICD-10-CM | POA: Diagnosis not present

## 2015-06-23 DIAGNOSIS — F329 Major depressive disorder, single episode, unspecified: Secondary | ICD-10-CM | POA: Insufficient documentation

## 2015-06-23 DIAGNOSIS — K661 Hemoperitoneum: Secondary | ICD-10-CM | POA: Diagnosis not present

## 2015-06-23 DIAGNOSIS — O001 Tubal pregnancy without intrauterine pregnancy: Secondary | ICD-10-CM | POA: Diagnosis present

## 2015-06-23 DIAGNOSIS — I10 Essential (primary) hypertension: Secondary | ICD-10-CM | POA: Diagnosis not present

## 2015-06-23 HISTORY — PX: LAPAROSCOPY: SHX197

## 2015-06-23 HISTORY — PX: UNILATERAL SALPINGECTOMY: SHX6160

## 2015-06-23 LAB — CBC
HEMATOCRIT: 28.3 % — AB (ref 36.0–46.0)
HEMOGLOBIN: 9.5 g/dL — AB (ref 12.0–15.0)
MCH: 31.8 pg (ref 26.0–34.0)
MCHC: 33.6 g/dL (ref 30.0–36.0)
MCV: 94.6 fL (ref 78.0–100.0)
Platelets: 327 10*3/uL (ref 150–400)
RBC: 2.99 MIL/uL — ABNORMAL LOW (ref 3.87–5.11)
RDW: 13.6 % (ref 11.5–15.5)
WBC: 19 10*3/uL — AB (ref 4.0–10.5)

## 2015-06-23 LAB — TYPE AND SCREEN
ABO/RH(D): O POS
ANTIBODY SCREEN: NEGATIVE

## 2015-06-23 SURGERY — LAPAROSCOPY, DIAGNOSTIC
Anesthesia: General | Site: Abdomen

## 2015-06-23 MED ORDER — FAMOTIDINE IN NACL 20-0.9 MG/50ML-% IV SOLN
20.0000 mg | Freq: Once | INTRAVENOUS | Status: AC
  Administered 2015-06-23: 20 mg via INTRAVENOUS

## 2015-06-23 MED ORDER — LACTATED RINGERS IR SOLN
Status: DC | PRN
  Administered 2015-06-23: 3000 mL

## 2015-06-23 MED ORDER — KETOROLAC TROMETHAMINE 30 MG/ML IJ SOLN
INTRAMUSCULAR | Status: DC | PRN
Start: 2015-06-23 — End: 2015-06-23
  Administered 2015-06-23: 30 mg via INTRAVENOUS

## 2015-06-23 MED ORDER — PROPOFOL 10 MG/ML IV BOLUS
INTRAVENOUS | Status: DC | PRN
  Administered 2015-06-23: 20 mg via INTRAVENOUS
  Administered 2015-06-23: 200 mg via INTRAVENOUS

## 2015-06-23 MED ORDER — ONDANSETRON HCL 4 MG/2ML IJ SOLN
INTRAMUSCULAR | Status: AC
  Filled 2015-06-23: qty 2

## 2015-06-23 MED ORDER — FENTANYL CITRATE (PF) 100 MCG/2ML IJ SOLN
25.0000 ug | INTRAMUSCULAR | Status: DC | PRN
  Administered 2015-06-23: 50 ug via INTRAVENOUS

## 2015-06-23 MED ORDER — BUPIVACAINE HCL (PF) 0.25 % IJ SOLN
INTRAMUSCULAR | Status: DC | PRN
  Administered 2015-06-23: 15 mL

## 2015-06-23 MED ORDER — MIDAZOLAM HCL 2 MG/2ML IJ SOLN
INTRAMUSCULAR | Status: DC | PRN
  Administered 2015-06-23: 2 mg via INTRAVENOUS

## 2015-06-23 MED ORDER — FENTANYL CITRATE (PF) 100 MCG/2ML IJ SOLN
INTRAMUSCULAR | Status: AC
  Filled 2015-06-23: qty 2

## 2015-06-23 MED ORDER — NEOSTIGMINE METHYLSULFATE 10 MG/10ML IV SOLN
INTRAVENOUS | Status: AC
  Filled 2015-06-23: qty 1

## 2015-06-23 MED ORDER — IBUPROFEN 600 MG PO TABS: 600.0000 mg | ORAL_TABLET | Freq: Four times a day (QID) | ORAL | Status: DC | PRN

## 2015-06-23 MED ORDER — LIDOCAINE HCL (CARDIAC) 20 MG/ML IV SOLN
INTRAVENOUS | Status: AC
  Filled 2015-06-23: qty 5

## 2015-06-23 MED ORDER — SCOPOLAMINE 1 MG/3DAYS TD PT72
MEDICATED_PATCH | TRANSDERMAL | Status: DC | PRN
  Administered 2015-06-23: 1 via TRANSDERMAL

## 2015-06-23 MED ORDER — HYDRALAZINE HCL 20 MG/ML IJ SOLN
INTRAMUSCULAR | Status: DC | PRN
  Administered 2015-06-23 (×2): 5 mg via INTRAVENOUS

## 2015-06-23 MED ORDER — KETOROLAC TROMETHAMINE 30 MG/ML IJ SOLN
INTRAMUSCULAR | Status: AC
  Filled 2015-06-23: qty 1

## 2015-06-23 MED ORDER — ROCURONIUM BROMIDE 100 MG/10ML IV SOLN
INTRAVENOUS | Status: DC | PRN
  Administered 2015-06-23 (×2): 10 mg via INTRAVENOUS

## 2015-06-23 MED ORDER — DOCUSATE SODIUM 100 MG PO CAPS: 100.0000 mg | ORAL_CAPSULE | Freq: Two times a day (BID) | ORAL | Status: DC

## 2015-06-23 MED ORDER — SODIUM CHLORIDE 0.9 % IJ SOLN
INTRAMUSCULAR | Status: DC | PRN
  Administered 2015-06-23: 10 mL

## 2015-06-23 MED ORDER — CITRIC ACID-SODIUM CITRATE 334-500 MG/5ML PO SOLN
ORAL | Status: AC
  Filled 2015-06-23: qty 15

## 2015-06-23 MED ORDER — ONDANSETRON HCL 4 MG/2ML IJ SOLN
INTRAMUSCULAR | Status: DC | PRN
  Administered 2015-06-23: 4 mg via INTRAVENOUS

## 2015-06-23 MED ORDER — MIDAZOLAM HCL 2 MG/2ML IJ SOLN
INTRAMUSCULAR | Status: AC
  Filled 2015-06-23: qty 2

## 2015-06-23 MED ORDER — LACTATED RINGERS IV SOLN
INTRAVENOUS | Status: DC
  Administered 2015-06-23 (×2): via INTRAVENOUS

## 2015-06-23 MED ORDER — GLYCOPYRROLATE 0.2 MG/ML IJ SOLN
INTRAMUSCULAR | Status: DC | PRN
  Administered 2015-06-23: 0.6 mg via INTRAVENOUS

## 2015-06-23 MED ORDER — LABETALOL HCL 5 MG/ML IV SOLN
INTRAVENOUS | Status: AC
  Filled 2015-06-23: qty 4

## 2015-06-23 MED ORDER — PROPOFOL 10 MG/ML IV BOLUS
INTRAVENOUS | Status: AC
  Filled 2015-06-23: qty 20

## 2015-06-23 MED ORDER — DEXAMETHASONE SODIUM PHOSPHATE 4 MG/ML IJ SOLN
INTRAMUSCULAR | Status: AC
  Filled 2015-06-23: qty 1

## 2015-06-23 MED ORDER — FENTANYL CITRATE (PF) 250 MCG/5ML IJ SOLN
INTRAMUSCULAR | Status: AC
  Filled 2015-06-23: qty 5

## 2015-06-23 MED ORDER — GLYCOPYRROLATE 0.2 MG/ML IJ SOLN
INTRAMUSCULAR | Status: AC
  Filled 2015-06-23: qty 3

## 2015-06-23 MED ORDER — ROCURONIUM BROMIDE 100 MG/10ML IV SOLN
INTRAVENOUS | Status: AC
  Filled 2015-06-23: qty 1

## 2015-06-23 MED ORDER — ONDANSETRON HCL 4 MG/2ML IJ SOLN: 4.0000 mg | Freq: Once | INTRAMUSCULAR | Status: DC | PRN

## 2015-06-23 MED ORDER — SUCCINYLCHOLINE CHLORIDE 20 MG/ML IJ SOLN
INTRAMUSCULAR | Status: AC
  Filled 2015-06-23: qty 1

## 2015-06-23 MED ORDER — FENTANYL CITRATE (PF) 100 MCG/2ML IJ SOLN
INTRAMUSCULAR | Status: DC | PRN
  Administered 2015-06-23: 100 ug via INTRAVENOUS
  Administered 2015-06-23: 50 ug via INTRAVENOUS
  Administered 2015-06-23: 100 ug via INTRAVENOUS
  Administered 2015-06-23: 50 ug via INTRAVENOUS
  Administered 2015-06-23: 100 ug via INTRAVENOUS
  Administered 2015-06-23 (×2): 25 ug via INTRAVENOUS

## 2015-06-23 MED ORDER — CITRIC ACID-SODIUM CITRATE 334-500 MG/5ML PO SOLN
30.0000 mL | Freq: Once | ORAL | Status: AC
  Administered 2015-06-23: 30 mL via ORAL

## 2015-06-23 MED ORDER — SCOPOLAMINE 1 MG/3DAYS TD PT72
MEDICATED_PATCH | TRANSDERMAL | Status: AC
  Filled 2015-06-23: qty 1

## 2015-06-23 MED ORDER — LIDOCAINE HCL (CARDIAC) 20 MG/ML IV SOLN
INTRAVENOUS | Status: DC | PRN
  Administered 2015-06-23: 80 mg via INTRAVENOUS

## 2015-06-23 MED ORDER — NEOSTIGMINE METHYLSULFATE 10 MG/10ML IV SOLN
INTRAVENOUS | Status: DC | PRN
  Administered 2015-06-23: 4 mg via INTRAVENOUS

## 2015-06-23 MED ORDER — FAMOTIDINE IN NACL 20-0.9 MG/50ML-% IV SOLN
INTRAVENOUS | Status: AC
  Filled 2015-06-23: qty 50

## 2015-06-23 MED ORDER — SUCCINYLCHOLINE CHLORIDE 20 MG/ML IJ SOLN
INTRAMUSCULAR | Status: DC | PRN
  Administered 2015-06-23: 120 mg via INTRAVENOUS

## 2015-06-23 MED ORDER — HYDRALAZINE HCL 20 MG/ML IJ SOLN
INTRAMUSCULAR | Status: AC
  Filled 2015-06-23: qty 1

## 2015-06-23 MED ORDER — DEXAMETHASONE SODIUM PHOSPHATE 10 MG/ML IJ SOLN
INTRAMUSCULAR | Status: DC | PRN
  Administered 2015-06-23: 4 mg via INTRAVENOUS

## 2015-06-23 MED ORDER — HYDROCODONE-ACETAMINOPHEN 5-325 MG PO TABS: 1.0000 | ORAL_TABLET | Freq: Four times a day (QID) | ORAL | Status: DC | PRN

## 2015-06-23 SURGICAL SUPPLY — 30 items
CLOTH BEACON ORANGE TIMEOUT ST (SAFETY) ×4 IMPLANT
DEVICE TROCAR PUNCTURE CLOSURE (ENDOMECHANICALS) IMPLANT
DRSG COVADERM PLUS 2X2 (GAUZE/BANDAGES/DRESSINGS) IMPLANT
DRSG OPSITE POSTOP 3X4 (GAUZE/BANDAGES/DRESSINGS) ×4 IMPLANT
DURAPREP 26ML APPLICATOR (WOUND CARE) ×4 IMPLANT
FORCEPS CUTTING 33CM 5MM (CUTTING FORCEPS) IMPLANT
GLOVE BIOGEL PI IND STRL 6.5 (GLOVE) ×4 IMPLANT
GLOVE BIOGEL PI IND STRL 7.0 (GLOVE) ×2 IMPLANT
GLOVE BIOGEL PI INDICATOR 6.5 (GLOVE) ×4
GLOVE BIOGEL PI INDICATOR 7.0 (GLOVE) ×2
GLOVE ECLIPSE 6.5 STRL STRAW (GLOVE) ×4 IMPLANT
GOWN STRL REUS W/TWL LRG LVL3 (GOWN DISPOSABLE) ×8 IMPLANT
LIQUID BAND (GAUZE/BANDAGES/DRESSINGS) ×4 IMPLANT
NEEDLE INSUFFLATION 120MM (ENDOMECHANICALS) ×4 IMPLANT
PACK LAPAROSCOPY BASIN (CUSTOM PROCEDURE TRAY) ×4 IMPLANT
PAD TRENDELENBURG POSITION (MISCELLANEOUS) ×4 IMPLANT
POUCH SPECIMEN RETRIEVAL 10MM (ENDOMECHANICALS) ×4 IMPLANT
SET IRRIG TUBING LAPAROSCOPIC (IRRIGATION / IRRIGATOR) ×4 IMPLANT
SHEARS HARMONIC ACE PLUS 36CM (ENDOMECHANICALS) ×4 IMPLANT
SLEEVE XCEL OPT CAN 5 100 (ENDOMECHANICALS) ×8 IMPLANT
SUT MON AB 4-0 PS1 27 (SUTURE) ×4 IMPLANT
SUT VIC AB 0 CT1 27 (SUTURE) ×2
SUT VIC AB 0 CT1 27XBRD ANBCTR (SUTURE) ×2 IMPLANT
SUT VICRYL 0 UR6 27IN ABS (SUTURE) ×4 IMPLANT
TOWEL OR 17X24 6PK STRL BLUE (TOWEL DISPOSABLE) ×8 IMPLANT
TRAY FOLEY CATH SILVER 14FR (SET/KITS/TRAYS/PACK) ×4 IMPLANT
TROCAR XCEL NON-BLD 11X100MML (ENDOMECHANICALS) ×4 IMPLANT
TROCAR XCEL NON-BLD 5MMX100MML (ENDOMECHANICALS) ×4 IMPLANT
WARMER LAPAROSCOPE (MISCELLANEOUS) ×4 IMPLANT
WATER STERILE IRR 1000ML POUR (IV SOLUTION) ×4 IMPLANT

## 2015-06-23 NOTE — Op Note (Signed)
PREOPERATIVE DIAGNOSIS:  Suspected ectopic pregnancy, hemoperitoneum POSTOPERATIVE DIAGNOSIS: Left ectopic pregnancy, hemoperitoneum PROCEDURE PERFORMED: Diagnostic laparoscopy, evacuation of hemoperitoneum, left salpingectomy with removal of ectopic SURGEON: Dr. Janyth Pupa ANESTHESIA: General endotracheal.  ESTIMATED BLOOD LOSS: 10cc.  (Hemoperitoneum 300cc) URINE OUTPUT: 150cc of clear yellow urine at the end of the procedure.  IV FLUIDS: 2000cc of crystalloid.  SPECIMEN(S): 1) Left fallopian tube with ectopic COMPLICATIONS: None.  CONDITION: Stable.  FINDINGS: No ascites or peritoneal studding was appreciated.  ~ 300cc of blood noted in abdomen Liver, gallbladder and bowel appeared grossly normal. Uterus normal size and shape.  Right ovary and tube were normal in appearance.  Left fallopian tube with 2-3cm ectopic pregnancy, normal left ovary.  Informed consent was obtained from the patient prior to taking her to the operating room where anesthesia was found to be adequate. She was placed in dorsal lithotomy position and examined under anesthesia. She was prepped and draped in normal sterile fashion. The bladder was catheterized with a foley under sterile technique.  A bi-valve speculum was then placed and the anterior lip of the cervix was grasped with the single tooth tenaculum. The Hulka uterine manipulator was then advanced into the uterus to provide uterine mobility. The speculum and tenaculum were then removed.  Attention was then turned to the patient's abdomen.  The infraumbilical area was injected with 10cc of local and an infraumbilical skin incision was made with the scalpel. The veress needle was carefully introduced into the peritoneal cavity while tenting the abdominal wall. Intraperitoneal placement was confirmed by use of a saline-drop test.  The gas was connected and confirmed intrabdominal placement by a low initial pressure of 7 mmHg. The abdomen was then insuflated with CO2  gas. The trocar and sleeve were then advanced without difficulty into the abdomen under direct visualization. Intraabdominal placement was confirmed by the laparoscope and surveillance of the abdomen was performed. Findings as mentioned above.  Two additional 36mm skin incision were made in the left and right lower quadrants with placement of the trocar under direct visualization in a similar fashion.  Evacuation of the hemoperitoneum was performed.  Both tubes and ovaries were assessed.  The Harmonic was then used for serial ligation of the left fallopian tube with ectopic.  Adequate hemostasis was obtained.  Copious suction irrigation was performed as the left ovary appeared to be involved with the ectopic- it was closely examined to ensure adequate hemostasis.  The endocatch bag was then placed through the 12 mm port and the specimens were removed in their entirety.  Again, irrigation was performed and hemostasis was noted and complete evacuation performed.   Re-examination of the uterus and abdominal cavity confirmed hemostasis. The trocars were removed under direct visualization from the patients abdomen with air allowed to fully escape. The fascia was then closed under direct visualization using 0 vicryl.  The infraumbilical port site was closed with monocryl. The lateral ports were closed with dermabond.  The manipulator and foley catheter was then removed from the cervix with no lacerations or bleeding identified. The patient tolerated the procedure well with all sponge, lap, and needle counts correct. The patient was taken to recovery in stable condition.   Janyth Pupa, DO [phone] (pager) [phone] (office)

## 2015-06-23 NOTE — H&P (Signed)
GYN ADMIT History and Physical   HPI. 35 y.o. G2P1001 present for follow up regarding SAB vs possible ectopic.  She was seen in the office today for follow up where she was noted to have continued pelvic abdominal pain.  She did have moderate bleeding overnight with what she thought was passage of tissue, but no further bleeding since that time.  The patient has required Percocet today to manage her pain.  Follow up hcg was performed today along with TVUS, which are suggestive of possible ruptured ectopic (see below)  Mild nausea, no vomiting.  no urinary complaints, no changes in bowel habits.    OBHx:SABx1, FTNSVD x1  Past Medical History  Diagnosis Date  . Depression   . Attention deficit disorder   . Hypertension     Prescriptions prior to admission  Medication Sig Dispense Refill Last Dose  . amphetamine-dextroamphetamine (ADDERALL) 20 MG tablet Take 20 mg by mouth 2 (two) times daily.   06/20/2015 at Unknown time  . buPROPion (WELLBUTRIN XL) 300 MG 24 hr tablet Take 300 mg by mouth daily.   06/20/2015 at Unknown time  . DULoxetine HCl (CYMBALTA PO) Take 1 tablet by mouth daily.   06/20/2015 at Unknown time  . glycopyrrolate (ROBINUL) 1 MG tablet Take 0.5-1 mg by mouth 2 (two) times daily.   06/20/2015 at Unknown time  . hydrALAZINE (APRESOLINE) 25 MG tablet Take 25 mg by mouth 2 (two) times daily.   06/20/2015 at Unknown time  . hydrochlorothiazide (HYDRODIURIL) 25 MG tablet Take 25 mg by mouth daily.   06/20/2015 at Unknown time  . labetalol (NORMODYNE) 100 MG tablet Take 100 mg by mouth 2 (two) times daily.   06/20/2015 at 8:00am  . cyclobenzaprine (FLEXERIL) 5 MG tablet Take 1 tablet (5 mg total) by mouth 3 (three) times daily as needed for muscle spasms. (Patient not taking: Reported on 06/21/2015) 30 tablet 0   . HYDROcodone-acetaminophen (NORCO/VICODIN)  5-325 MG per tablet 1 to 2 tabs every 4 to 6 hours as needed for pain. (Patient not taking: Reported on 06/21/2015) 20 tablet 0   . ibuprofen (ADVIL,MOTRIN) 800 MG tablet Take 1 tablet (800 mg total) by mouth 3 (three) times daily. (Patient not taking: Reported on 06/21/2015) 21 tablet 0             Medical History: HTN, ADHD, combined subtype, Depression, Lactose intolerence.        Gyn History:  Sexual activity currently sexually active.  Periods : irregular.  Birth control none.  Last pap smear date 01/08/13 Normal.  Denies H/O Last mammogram date.  Denies H/O Abnormal pap smear.        OB History:  Pregnancy # 1 live birth, vaginal delivery.        Hospitalization/Major Diagnostic Procedure: see surgery .        Family History: No Family History documented..        Social History:  General:  Tobacco use  cigarettes: Current smoker Frequency: intermittent smoker Estimated Pack-years: 5 Tobacco history last updated 06/23/2015 no Alcohol.  no Recreational drug use.  Tobacco Exposure: none.        Allergies: N.K.D.A.    O: BP 157/89 mmHg  Pulse 106  Resp 18  LMP 06/01/2015 (Approximate) Gen: NAD, A&Ox3 Heart/Lungs: S1S2 RRR, CTAB Abdomen:+LLQ tenderness GU: Deferred- performed in office this am- cervix closed, no bleeding appreciated Extrem: no edema, no calf tenderness LE bilaterally  Labs:  CBC    Component Value Date/Time  WBC 19.4* 06/22/2015 0350   RBC 3.92 06/22/2015 0350   HGB 12.5 06/22/2015 0350   HCT 36.3 06/22/2015 0350   PLT 441* 06/22/2015 0350   MCV 92.6 06/22/2015 0350   MCH 31.9 06/22/2015 0350   MCHC 34.4 06/22/2015 0350   RDW 13.6 06/22/2015 0350   HCG leveL: 4231 (3/21) --> 3519 (3/23) --> 1969 (3/26) --> 1454 (3/28)  Initial imaging on 3/26: Irregular sac-like structure within the endometrium with moderate volume complex fluid posterior in cul-de-sac and LEFT adnexal fluid. Differential consideration  would be spontaneous abortion in complex versus occult ectopic pregnancy. Early pregnancy is less favored but cannot be excluded.  Repeat US today (3/28): IMPRESSION:  1. No definite intrauterine gestational sac or endometrial thickening. 2. Normal ovaries. 3. Moderate complex free pelvic fluid similar to prior study. Recommend correlation with beta HCG level.   A/P: 35 y.o. G2P1001 admitted for Diagnostic laparoscopy, possible removal of ectopic, possible salpingectomy -NPO -LR @ 125cc/hr -CBC, T&S to be obtained -Based on findings concern for ruptured ectopic as there is increased fluid in the pelvis, considerable pelvic pain and only slight decline in hCG despite "passage of tissue."  Reviewed risk/benefit and indications of surgery- pt aware of risk of bleeding, infection and injury. Questions and concerns were addressed and pt wishes to proceed.  Janyth Pupa, DO [phone] (pager) [phone] (office)

## 2015-06-23 NOTE — Anesthesia Procedure Notes (Signed)
Procedure Name: Intubation Date/Time: 06/23/2015 7:35 PM Performed by: Raenette Rover Pre-anesthesia Checklist: Patient identified, Emergency Drugs available, Suction available and Patient being monitored Patient Re-evaluated:Patient Re-evaluated prior to inductionOxygen Delivery Method: Circle system utilized Preoxygenation: Pre-oxygenation with 100% oxygen Intubation Type: IV induction, Rapid sequence and Cricoid Pressure applied Laryngoscope Size: Mac and 3 Grade View: Grade I Tube type: Oral Tube size: 7.0 mm Number of attempts: 1 Airway Equipment and Method: Stylet Placement Confirmation: ETT inserted through vocal cords under direct vision,  positive ETCO2,  CO2 detector and breath sounds checked- equal and bilateral Secured at: 19 cm Tube secured with: Tape Dental Injury: Teeth and Oropharynx as per pre-operative assessment

## 2015-06-23 NOTE — Anesthesia Preprocedure Evaluation (Addendum)
Anesthesia Evaluation  Patient identified by MRN, date of birth, ID band Patient awake    Reviewed: Allergy & Precautions, NPO status , Patient's Chart, lab work & pertinent test results, reviewed documented beta blocker date and time   Airway Mallampati: II  TM Distance: >3 FB Neck ROM: Full    Dental  (+) Teeth Intact, Dental Advisory Given   Pulmonary Current Smoker (Stopped 8 days ago),    Pulmonary exam normal breath sounds clear to auscultation       Cardiovascular Exercise Tolerance: Good hypertension, Pt. on medications and Pt. on home beta blockers (-) angina(-) CAD and (-) Past MI Normal cardiovascular exam Rhythm:Regular Rate:Normal     Neuro/Psych PSYCHIATRIC DISORDERS Depression negative neurological ROS     GI/Hepatic negative GI ROS, Neg liver ROS,   Endo/Other  Obesity   Renal/GU negative Renal ROS     Musculoskeletal negative musculoskeletal ROS (+)   Abdominal   Peds  (+) ADHD Hematology negative hematology ROS (+)   Anesthesia Other Findings Day of surgery medications reviewed with the patient.  Ate at 1600.  Reproductive/Obstetrics Ectopic pregnancy                             Anesthesia Physical Anesthesia Plan  ASA: II and emergent  Anesthesia Plan: General   Post-op Pain Management:    Induction: Intravenous, Rapid sequence and Cricoid pressure planned  Airway Management Planned: Oral ETT  Additional Equipment:   Intra-op Plan:   Post-operative Plan: Extubation in OR  Informed Consent: I have reviewed the patients History and Physical, chart, labs and discussed the procedure including the risks, benefits and alternatives for the proposed anesthesia with the patient or authorized representative who has indicated his/her understanding and acceptance.   Dental advisory given  Plan Discussed with: CRNA  Anesthesia Plan Comments: (Risks/benefits of  general anesthesia discussed with patient including risk of damage to teeth, lips, gum, and tongue, nausea/vomiting, allergic reactions to medications, and the possibility of heart attack, stroke and death.  All patient questions answered.  Patient wishes to proceed.)        Anesthesia Quick Evaluation

## 2015-06-23 NOTE — MAU Note (Signed)
Patient presents with ruptured ectopic

## 2015-06-23 NOTE — Transfer of Care (Signed)
Immediate Anesthesia Transfer of Care Note  Patient: Loretta Brown  Procedure(s) Performed: Procedure(s): LAPAROSCOPY DIAGNOSTIC, Evacuation Hemoperitoneum (N/A) Left  SALPINGECTOMY with Removal Ectopic Pregnancy (Left)  Patient Location: PACU  Anesthesia Type:General  Level of Consciousness: awake, alert , oriented and patient cooperative  Airway & Oxygen Therapy: Patient Spontanous Breathing and Patient connected to face mask oxygen  Post-op Assessment: Report given to RN and Post -op Vital signs reviewed and stable  Post vital signs: Reviewed and stable  Last Vitals:  Filed Vitals:   06/23/15 1838  BP: 157/89  Pulse: 106  Resp: 18    Complications: No apparent anesthesia complications

## 2015-06-23 NOTE — Discharge Instructions (Addendum)
HOME INSTRUCTIONS  Please note any unusual or excessive bleeding, pain, swelling. Mild dizziness or drowsiness are normal for about 24 hours after surgery.   Shower when comfortable  Restrictions: No driving for 24 hours or while taking pain medications.  Activity:  No heavy lifting (> 10 lbs), nothing in vagina (no tampons, douching, or intercourse) x 4 weeks; no tub baths for 4 weeks Vaginal spotting is expected but if your bleeding is heavy, period like,  please call the office   Incision: the bandaids will fall off when they are ready to; you may clean your incision with mild soap and water but do not rub or scrub the incision site.  You may experience slight bloody drainage from your incision periodically.  This is normal.  If you experience a large amount of drainage or the incision opens, please call your physician who will likely direct you to the emergency department.  Diet:  You may return to your regular diet.  Do not eat large meals.  Eat small frequent meals throughout the day.  Continue to drink a good amount of water at least 6-8 glasses of water per day, hydration is very important for the healing process.  Pain Management: You may alternate between percocet and motrin as needed for pain.  Percocet may cause constipation so please take colace twice daily as needed.  Always take prescription pain medication with food, it may cause constipation, increase fluids and fiber and you may want to take an over-the-counter stool softener like Colace as needed up to 2x a day.    Alcohol -- Avoid for 24 hours and while taking pain medications.  Nausea: Take sips of ginger ale or soda  Fever -- Call physician if temperature over 101 degrees  Follow up:  If you do not already have a follow up appointment scheduled, please call the office at [phone].  If you experience fever (a temperature greater than 100.4), pain unrelieved by pain medication, shortness of breath, swelling of a  single leg, or any other symptoms which are concerning to you please the office immediately.   Post Anesthesia Home Care Instructions  Activity: Get plenty of rest for the remainder of the day. A responsible adult should stay with you for 24 hours following the procedure.  For the next 24 hours, DO NOT: -Drive a car -Paediatric nurse -Drink alcoholic beverages -Take any medication unless instructed by your physician -Make any legal decisions or sign important papers.  Meals: Start with liquid foods such as gelatin or soup. Progress to regular foods as tolerated. Avoid greasy, spicy, heavy foods. If nausea and/or vomiting occur, drink only clear liquids until the nausea and/or vomiting subsides. Call your physician if vomiting continues.  Special Instructions/Symptoms: Your throat may feel dry or sore from the anesthesia or the breathing tube placed in your throat during surgery. If this causes discomfort, gargle with warm salt water. The discomfort should disappear within 24 hours.  If you had a scopolamine patch placed behind your ear for the management of post- operative nausea and/or vomiting:  1. The medication in the patch is effective for 72 hours, after which it should be removed.  Wrap patch in a tissue and discard in the trash. Wash hands thoroughly with soap and water. 2. You may remove the patch earlier than 72 hours if you experience unpleasant side effects which may include dry mouth, dizziness or visual disturbances. 3. Avoid touching the patch. Wash your hands with soap and water after contact  with the patch.

## 2015-06-24 ENCOUNTER — Encounter (HOSPITAL_COMMUNITY): Payer: Self-pay | Admitting: Obstetrics & Gynecology

## 2015-06-24 ENCOUNTER — Ambulatory Visit (HOSPITAL_COMMUNITY): Admission: RE | Admit: 2015-06-24 | Payer: 59 | Source: Ambulatory Visit

## 2015-06-24 NOTE — Anesthesia Postprocedure Evaluation (Signed)
Anesthesia Post Note  Patient: [name]  Procedure(s) Performed: Procedure(s) (LRB): LAPAROSCOPY DIAGNOSTIC, Evacuation Hemoperitoneum (N/A) Left  SALPINGECTOMY with Removal Ectopic Pregnancy (Left)  Patient location during evaluation: PACU Anesthesia Type: General Level of consciousness: awake and alert Pain management: pain level controlled Vital Signs Assessment: post-procedure vital signs reviewed and stable Respiratory status: spontaneous breathing, nonlabored ventilation, respiratory function stable and patient connected to nasal cannula oxygen Cardiovascular status: blood pressure returned to baseline and stable Postop Assessment: no signs of nausea or vomiting Anesthetic complications: no    Last Vitals:  Filed Vitals:   06/23/15 2215 06/23/15 2300  BP: 125/81 143/89  Pulse: 93 102  Temp: 37.1 C 37.1 C  Resp: 16 16    Last Pain:  Filed Vitals:   06/23/15 2316  PainSc: 3                  Catalina Gravel

## 2016-01-13 ENCOUNTER — Other Ambulatory Visit (HOSPITAL_COMMUNITY)
Admission: RE | Admit: 2016-01-13 | Discharge: 2016-01-13 | Disposition: A | Discharge status: Home / Self Care | Payer: 59 | Source: Ambulatory Visit | Attending: Obstetrics & Gynecology | Admitting: Obstetrics & Gynecology

## 2016-01-13 ENCOUNTER — Other Ambulatory Visit: Payer: Self-pay | Admitting: Obstetrics & Gynecology

## 2016-01-13 DIAGNOSIS — Z01419 Encounter for gynecological examination (general) (routine) without abnormal findings: Secondary | ICD-10-CM | POA: Diagnosis present

## 2016-01-13 DIAGNOSIS — Z1151 Encounter for screening for human papillomavirus (HPV): Secondary | ICD-10-CM | POA: Diagnosis present

## 2016-01-18 LAB — CYTOLOGY - PAP
ADEQUACY: ABSENT
DIAGNOSIS: NEGATIVE
HPV (WINDOPATH): NOT DETECTED

## 2016-04-25 ENCOUNTER — Encounter (HOSPITAL_COMMUNITY): Payer: Self-pay

## 2016-06-03 LAB — OB RESULTS CONSOLE HIV ANTIBODY (ROUTINE TESTING): HIV: NONREACTIVE

## 2016-06-03 LAB — OB RESULTS CONSOLE ABO/RH: RH TYPE: POSITIVE

## 2016-06-03 LAB — OB RESULTS CONSOLE GC/CHLAMYDIA
Chlamydia: NEGATIVE
Gonorrhea: NEGATIVE

## 2016-06-03 LAB — OB RESULTS CONSOLE RUBELLA ANTIBODY, IGM: Rubella: IMMUNE

## 2016-06-03 LAB — OB RESULTS CONSOLE RPR: RPR: NONREACTIVE

## 2016-06-03 LAB — OB RESULTS CONSOLE ANTIBODY SCREEN: Antibody Screen: NEGATIVE

## 2016-06-03 LAB — OB RESULTS CONSOLE HEPATITIS B SURFACE ANTIGEN: Hepatitis B Surface Ag: NEGATIVE

## 2016-06-15 ENCOUNTER — Ambulatory Visit: Payer: Self-pay | Admitting: Family Medicine

## 2016-09-22 ENCOUNTER — Other Ambulatory Visit (HOSPITAL_COMMUNITY): Payer: Self-pay | Admitting: Obstetrics & Gynecology

## 2016-09-22 DIAGNOSIS — O09523 Supervision of elderly multigravida, third trimester: Secondary | ICD-10-CM

## 2016-09-22 DIAGNOSIS — I10 Essential (primary) hypertension: Secondary | ICD-10-CM

## 2016-09-22 DIAGNOSIS — Z3689 Encounter for other specified antenatal screening: Secondary | ICD-10-CM

## 2016-09-22 DIAGNOSIS — Z3A30 30 weeks gestation of pregnancy: Secondary | ICD-10-CM

## 2016-10-12 ENCOUNTER — Encounter: Payer: Self-pay | Admitting: Registered"

## 2016-10-12 ENCOUNTER — Encounter: Payer: 59 | Attending: Obstetrics & Gynecology | Admitting: Registered"

## 2016-10-12 DIAGNOSIS — R7309 Other abnormal glucose: Secondary | ICD-10-CM

## 2016-10-12 DIAGNOSIS — Z3A00 Weeks of gestation of pregnancy not specified: Secondary | ICD-10-CM | POA: Diagnosis not present

## 2016-10-12 DIAGNOSIS — O24419 Gestational diabetes mellitus in pregnancy, unspecified control: Secondary | ICD-10-CM | POA: Insufficient documentation

## 2016-10-12 DIAGNOSIS — Z713 Dietary counseling and surveillance: Secondary | ICD-10-CM | POA: Insufficient documentation

## 2016-10-12 NOTE — Progress Notes (Signed)
Patient was seen on 10/12/2016 for Gestational Diabetes self-management class at the [facility]. The following learning objectives were met by the patient during this course:   States the definition of Gestational Diabetes  States why dietary management is important in controlling blood glucose  Describes the effects each nutrient has on blood glucose levels  Demonstrates ability to create a balanced meal plan  Demonstrates carbohydrate counting   States when to check blood glucose levels  Demonstrates proper blood glucose monitoring techniques  States the effect of stress and exercise on blood glucose levels  States the importance of limiting caffeine and abstaining from alcohol and smoking  Blood glucose monitor given: [employer] Lot # [id number] X Exp: 10/25/16 Blood glucose reading: 67  Patient instructed to monitor glucose levels: FBS: 60 - <95 1 hour: <140 2 hour: <120  Patient received handouts:  Nutrition Diabetes and Pregnancy  Carbohydrate Counting List  Patient will be seen for follow-up as needed.

## 2016-10-13 ENCOUNTER — Encounter (HOSPITAL_COMMUNITY): Payer: Self-pay | Admitting: *Deleted

## 2016-10-14 ENCOUNTER — Ambulatory Visit (HOSPITAL_COMMUNITY)
Admission: RE | Admit: 2016-10-14 | Discharge: 2016-10-14 | Disposition: A | Discharge status: Home / Self Care | Payer: 59 | Source: Ambulatory Visit | Attending: Obstetrics & Gynecology | Admitting: Obstetrics & Gynecology

## 2016-10-14 DIAGNOSIS — F329 Major depressive disorder, single episode, unspecified: Secondary | ICD-10-CM | POA: Insufficient documentation

## 2016-10-14 DIAGNOSIS — I10 Essential (primary) hypertension: Secondary | ICD-10-CM

## 2016-10-14 DIAGNOSIS — O09523 Supervision of elderly multigravida, third trimester: Secondary | ICD-10-CM | POA: Insufficient documentation

## 2016-10-14 DIAGNOSIS — O24419 Gestational diabetes mellitus in pregnancy, unspecified control: Secondary | ICD-10-CM | POA: Insufficient documentation

## 2016-10-14 DIAGNOSIS — O99333 Smoking (tobacco) complicating pregnancy, third trimester: Secondary | ICD-10-CM | POA: Insufficient documentation

## 2016-10-14 DIAGNOSIS — O163 Unspecified maternal hypertension, third trimester: Secondary | ICD-10-CM | POA: Insufficient documentation

## 2016-10-14 DIAGNOSIS — O99343 Other mental disorders complicating pregnancy, third trimester: Secondary | ICD-10-CM | POA: Insufficient documentation

## 2016-10-14 DIAGNOSIS — Z3A30 30 weeks gestation of pregnancy: Secondary | ICD-10-CM | POA: Diagnosis not present

## 2016-10-14 DIAGNOSIS — Z3689 Encounter for other specified antenatal screening: Secondary | ICD-10-CM

## 2016-10-14 NOTE — Consult Note (Signed)
Maternal Fetal Medicine Consultation  Requesting Provider(s): Dr. Nelda Marseille  Reason for consultation: Advanced maternal age, Lexapro exposure, chronic hypertension  HPI: Loretta Brown is a 36 yo G3P0111, EDD 12/21/2016 who is currently at 30w 2d seen for consultation due to advanced maternal age, lexapro exposure and chronic hypertension.  Loretta Brown reports that she first experienced problems with blood pressure during and after the delivery of her last child.  She was previously on HCTZ, Hydralazine and Labetalol.  The HCTC and Hydralazine were discontinued, and her blood pressure has been stable on Labetalol 200 mg BID.  Loretta Brown was started on Lexapro due to depression.  She was recently diagnosed with gestational diabetes- by her report, her blood sugars have been well-controlled on diet only.  The fetus is active.  She is without complaints today.  OB History: OB History    Gravida Para Term Preterm AB Living   4 1   1 1 1    SAB TAB Ectopic Multiple Live Births   1              PMH:  Past Medical History:  Diagnosis Date  . Attention deficit disorder   . Depression   . Hypertension     PSH:  Past Surgical History:  Procedure Laterality Date  . LAPAROSCOPY N/A 06/23/2015   Procedure: LAPAROSCOPY DIAGNOSTIC, Evacuation Hemoperitoneum;  Surgeon: Janyth Pupa, DO;  Location: [facility];  Service: Gynecology;  Laterality: N/A;  . NO PAST SURGERIES    . UNILATERAL SALPINGECTOMY Left 06/23/2015   Procedure: Left  SALPINGECTOMY with Removal Ectopic Pregnancy;  Surgeon: Janyth Pupa, DO;  Location: [facility];  Service: Gynecology;  Laterality: Left;   Meds:  Current Outpatient Prescriptions on File Prior to Encounter  Medication Sig Dispense Refill  . amphetamine-dextroamphetamine (ADDERALL) 20 MG tablet Take 20 mg by mouth 2 (two) times daily.    Marland Kitchen buPROPion (WELLBUTRIN XL) 300 MG 24 hr tablet Take 300 mg by mouth daily.    . cyclobenzaprine (FLEXERIL) 5 MG tablet Take 1 tablet (5 mg total) by  mouth 3 (three) times daily as needed for muscle spasms. (Patient not taking: Reported on 06/21/2015) 30 tablet 0  . docusate sodium (COLACE) 100 MG capsule Take 1 capsule (100 mg total) by mouth 2 (two) times daily. (Patient not taking: Reported on 10/14/2016) 30 capsule 0  . DULoxetine HCl (CYMBALTA PO) Take 1 tablet by mouth daily.    . Escitalopram Oxalate (LEXAPRO PO) Take by mouth.    Marland Kitchen glycopyrrolate (ROBINUL) 1 MG tablet Take 0.5-1 mg by mouth 2 (two) times daily.    . hydrALAZINE (APRESOLINE) 25 MG tablet Take 25 mg by mouth 2 (two) times daily.    . hydrochlorothiazide (HYDRODIURIL) 25 MG tablet Take 25 mg by mouth daily.    Marland Kitchen HYDROcodone-acetaminophen (NORCO/VICODIN) 5-325 MG tablet Take 1 tablet by mouth every 6 (six) hours as needed for moderate pain. (Patient not taking: Reported on 10/14/2016) 15 tablet 0  . hydroxyprogesterone caproate (MAKENA) 250 mg/mL OIL injection Inject 250 mg into the muscle once.    Marland Kitchen ibuprofen (ADVIL,MOTRIN) 600 MG tablet Take 1 tablet (600 mg total) by mouth every 6 (six) hours as needed. (Patient not taking: Reported on 10/14/2016) 30 tablet 0  . ibuprofen (ADVIL,MOTRIN) 800 MG tablet Take 1 tablet (800 mg total) by mouth 3 (three) times daily. (Patient not taking: Reported on 06/21/2015) 21 tablet 0  . labetalol (NORMODYNE) 100 MG tablet Take 100 mg by mouth 2 (two) times daily.    Marland Kitchen  Prenatal Vit-Fe Fumarate-FA (PRENATAL VITAMIN PO) Take by mouth.     No current facility-administered medications on file prior to encounter.    Allergies: No Known Allergies   FH: Denies birth defects or hereditary disorders  Soc:  Social History   Social History  . Marital status: Married    Spouse name: N/A  . Number of children: N/A  . Years of education: N/A   Occupational History  . Not on file.   Social History Main Topics  . Smoking status: Current Every Day Smoker  . Smokeless tobacco: Not on file  . Alcohol use Yes  . Drug use: Yes    Types: Marijuana   . Sexual activity: Not on file   Other Topics Concern  . Not on file   Social History Narrative  . No narrative on file    Review of Systems: no vaginal bleeding or cramping/contractions, no LOF, no nausea/vomiting. All other systems reviewed and are negative.  PE:  229 lbs, 90/67, 92  GEN: well-appearing female ABD: gravid, NT  Ultrasound: Single IUP at 30w 2d Limited views of the spine and profile were obtained The remainder of the fetal anatomy appears normal The estimated fetal weight is at the 82nd %tile.  The AC measures > 97th %tile. Normal amniotic fluid volume Posterior placenta without previa   A/P: 1) Single IUP at 30w 2d  2) Advanced maternal age - patient previously declined cell free fetal DNA.  Quad screen returned low risk for aneuploidy  3) A1 GDM - patient was recently started on a diet and is checking fingerstick values.  By her report, her blood sugars are all within the target range.  Ultrasound today showed an estimated fetal weight at the 82nd %tile with the [facility] measuring > 97th %tile.  Recommend follow up ultrasound in 4-6 weeks for interval growth. Please contact our office if you would prefer that this study be scheduled with MFM.  4) Chronic hypertension - The patient was initially on Labetalol, Hydralazine and HCTZ.  Over the course of her pregnancy, her blood pressures have been stable - she is now off HCTZ and Hydralazine and is currently on Labetalol 200 mg BID with blood pressures well controlled on this regimen.  Recommend continued close follow up - may need to adjust Labetalol doses as the pregnancy progresses.  5) Lexapro exposure in the 1st trimester - Based on experimental animal studies and human reports, therapeutic use of citalopram or escitalopram is not expected to increase the risk of congenital anomalies. Use of serotonin reuptake inhibitors late in pregnancy can be associated with a mild transient neonatal syndrome of central nervous  system, motor, respiratory, and gastrointestinal signs.  Like all medications in pregnancy, there is a risk/ benefit calculation that must be made.  If the patient needs this treatment, would continue the medication.   Thank you for the opportunity to be a part of the care of Shatoria Stooksbury. Please contact our office if we can be of further assistance.   I spent approximately 30 minutes with this patient with over 50% of time spent in face-to-face counseling.  Benjaman Lobe, MD Maternal Fetal Medicine

## 2016-10-28 ENCOUNTER — Ambulatory Visit (HOSPITAL_COMMUNITY)
Admission: RE | Admit: 2016-10-28 | Discharge: 2016-10-28 | Disposition: A | Discharge status: Home / Self Care | Payer: 59 | Source: Ambulatory Visit | Attending: Obstetrics & Gynecology | Admitting: Obstetrics & Gynecology

## 2016-10-28 ENCOUNTER — Other Ambulatory Visit (HOSPITAL_COMMUNITY): Payer: Self-pay | Admitting: Obstetrics & Gynecology

## 2016-10-28 DIAGNOSIS — O10013 Pre-existing essential hypertension complicating pregnancy, third trimester: Secondary | ICD-10-CM | POA: Insufficient documentation

## 2016-10-28 DIAGNOSIS — Z3A32 32 weeks gestation of pregnancy: Secondary | ICD-10-CM | POA: Insufficient documentation

## 2016-10-28 DIAGNOSIS — O24410 Gestational diabetes mellitus in pregnancy, diet controlled: Secondary | ICD-10-CM | POA: Insufficient documentation

## 2016-10-28 DIAGNOSIS — O368390 Maternal care for abnormalities of the fetal heart rate or rhythm, unspecified trimester, not applicable or unspecified: Secondary | ICD-10-CM

## 2016-10-28 DIAGNOSIS — O368330 Maternal care for abnormalities of the fetal heart rate or rhythm, third trimester, not applicable or unspecified: Secondary | ICD-10-CM | POA: Insufficient documentation

## 2016-10-28 DIAGNOSIS — O09523 Supervision of elderly multigravida, third trimester: Secondary | ICD-10-CM | POA: Diagnosis not present

## 2016-11-15 LAB — OB RESULTS CONSOLE GBS: STREP GROUP B AG: NEGATIVE

## 2016-11-17 ENCOUNTER — Encounter (HOSPITAL_COMMUNITY): Payer: Self-pay | Admitting: *Deleted

## 2016-11-17 ENCOUNTER — Telehealth (HOSPITAL_COMMUNITY): Payer: Self-pay | Admitting: *Deleted

## 2016-11-17 NOTE — Telephone Encounter (Signed)
Preadmission screen  

## 2016-11-29 ENCOUNTER — Inpatient Hospital Stay (EMERGENCY_DEPARTMENT_HOSPITAL)
Admission: AD | Admit: 2016-11-29 | Discharge: 2016-11-29 | Disposition: A | Discharge status: Home / Self Care | Payer: 59 | Source: Ambulatory Visit | Attending: Obstetrics and Gynecology | Admitting: Obstetrics and Gynecology

## 2016-11-29 ENCOUNTER — Encounter (HOSPITAL_COMMUNITY): Payer: Self-pay | Admitting: *Deleted

## 2016-11-29 DIAGNOSIS — O09523 Supervision of elderly multigravida, third trimester: Secondary | ICD-10-CM | POA: Insufficient documentation

## 2016-11-29 DIAGNOSIS — O163 Unspecified maternal hypertension, third trimester: Secondary | ICD-10-CM

## 2016-11-29 DIAGNOSIS — F17200 Nicotine dependence, unspecified, uncomplicated: Secondary | ICD-10-CM

## 2016-11-29 DIAGNOSIS — O10919 Unspecified pre-existing hypertension complicating pregnancy, unspecified trimester: Secondary | ICD-10-CM

## 2016-11-29 DIAGNOSIS — F329 Major depressive disorder, single episode, unspecified: Secondary | ICD-10-CM | POA: Insufficient documentation

## 2016-11-29 DIAGNOSIS — Z3A36 36 weeks gestation of pregnancy: Secondary | ICD-10-CM | POA: Insufficient documentation

## 2016-11-29 DIAGNOSIS — O99333 Smoking (tobacco) complicating pregnancy, third trimester: Secondary | ICD-10-CM

## 2016-11-29 DIAGNOSIS — O99343 Other mental disorders complicating pregnancy, third trimester: Secondary | ICD-10-CM | POA: Insufficient documentation

## 2016-11-29 DIAGNOSIS — O09893 Supervision of other high risk pregnancies, third trimester: Secondary | ICD-10-CM

## 2016-11-29 DIAGNOSIS — O24410 Gestational diabetes mellitus in pregnancy, diet controlled: Secondary | ICD-10-CM | POA: Insufficient documentation

## 2016-11-29 DIAGNOSIS — O1002 Pre-existing essential hypertension complicating childbirth: Secondary | ICD-10-CM | POA: Diagnosis not present

## 2016-11-29 DIAGNOSIS — O114 Pre-existing hypertension with pre-eclampsia, complicating childbirth: Secondary | ICD-10-CM | POA: Diagnosis not present

## 2016-11-29 LAB — CBC
HCT: 33.5 % — ABNORMAL LOW (ref 36.0–46.0)
Hemoglobin: 11.4 g/dL — ABNORMAL LOW (ref 12.0–15.0)
MCH: 30.1 pg (ref 26.0–34.0)
MCHC: 34 g/dL (ref 30.0–36.0)
MCV: 88.4 fL (ref 78.0–100.0)
Platelets: 368 10*3/uL (ref 150–400)
RBC: 3.79 MIL/uL — ABNORMAL LOW (ref 3.87–5.11)
RDW: 14.8 % (ref 11.5–15.5)
WBC: 11.2 10*3/uL — ABNORMAL HIGH (ref 4.0–10.5)

## 2016-11-29 LAB — URINALYSIS, ROUTINE W REFLEX MICROSCOPIC
BILIRUBIN URINE: NEGATIVE
Glucose, UA: NEGATIVE mg/dL
Hgb urine dipstick: NEGATIVE
Ketones, ur: NEGATIVE mg/dL
Nitrite: NEGATIVE
PROTEIN: 30 mg/dL — AB
SPECIFIC GRAVITY, URINE: 1.016 (ref 1.005–1.030)
pH: 6 (ref 5.0–8.0)

## 2016-11-29 LAB — COMPREHENSIVE METABOLIC PANEL
ALBUMIN: 2.9 g/dL — AB (ref 3.5–5.0)
ALT: 12 U/L — ABNORMAL LOW (ref 14–54)
ANION GAP: 9 (ref 5–15)
AST: 21 U/L (ref 15–41)
Alkaline Phosphatase: 111 U/L (ref 38–126)
BILIRUBIN TOTAL: 0.4 mg/dL (ref 0.3–1.2)
BUN: 9 mg/dL (ref 6–20)
CO2: 24 mmol/L (ref 22–32)
Calcium: 8.7 mg/dL — ABNORMAL LOW (ref 8.9–10.3)
Chloride: 103 mmol/L (ref 101–111)
Creatinine, Ser: 0.78 mg/dL (ref 0.44–1.00)
GFR calc Af Amer: >60 mL/min (ref >60)
GFR calc non Af Amer: >60 mL/min (ref >60)
GLUCOSE: 93 mg/dL (ref 65–99)
Potassium: 3.9 mmol/L (ref 3.5–5.1)
SODIUM: 136 mmol/L (ref 135–145)
TOTAL PROTEIN: 6.2 g/dL — AB (ref 6.5–8.1)

## 2016-11-29 LAB — PROTEIN / CREATININE RATIO, URINE
Creatinine, Urine: 222 mg/dL
Protein Creatinine Ratio: 0.13 mg/mg{Cre} (ref 0.00–0.15)
TOTAL PROTEIN, URINE: 29 mg/dL

## 2016-11-29 NOTE — Discharge Instructions (Signed)
Hypertension During Pregnancy °Hypertension, commonly called high blood pressure, is when the force of blood pumping through your arteries is too strong. Arteries are blood vessels that carry blood from the heart throughout the body. Hypertension during pregnancy can cause problems for you and your baby. Your baby may be born early (prematurely) or may not weigh as much as he or she should at birth. Very bad cases of hypertension during pregnancy can be life-threatening. °Different types of hypertension can occur during pregnancy. These include: °· Chronic hypertension. This happens when: °? You have hypertension before pregnancy and it continues during pregnancy. °? You develop hypertension before you are [redacted] weeks pregnant, and it continues during pregnancy. °· Gestational hypertension. This is hypertension that develops after the 20th week of pregnancy. °· Preeclampsia, also called toxemia of pregnancy. This is a very serious type of hypertension that develops only during pregnancy. It affects the whole body, and it can be very dangerous for you and your baby. ° °Gestational hypertension and preeclampsia usually go away within 6 weeks after your baby is born. Women who have hypertension during pregnancy have a greater chance of developing hypertension later in life or during future pregnancies. °What are the causes? °The exact cause of hypertension is not known. °What increases the risk? °There are certain factors that make it more likely for you to develop hypertension during pregnancy. These include: °· Having hypertension during a previous pregnancy or prior to pregnancy. °· Being overweight. °· Being older than age 40. °· Being pregnant for the first time or being pregnant with more than one baby. °· Becoming pregnant using fertilization methods such as IVF (in vitro fertilization). °· Having diabetes, kidney problems, or systemic lupus erythematosus. °· Having a family history of hypertension. ° °What are the  signs or symptoms? °Chronic hypertension and gestational hypertension rarely cause symptoms. Preeclampsia causes symptoms, which may include: °· Increased protein in your urine. Your health care provider will check for this at every visit before you give birth (prenatal visit). °· Severe headaches. °· Sudden weight gain. °· Swelling of the hands, face, legs, and feet. °· Nausea and vomiting. °· Vision problems, such as blurred or double vision. °· Numbness in the face, arms, legs, and feet. °· Dizziness. °· Slurred speech. °· Sensitivity to bright lights. °· Abdominal pain. °· Convulsions. ° °How is this diagnosed? °You may be diagnosed with hypertension during a routine prenatal exam. At each prenatal visit, you may: °· Have a urine test to check for high amounts of protein in your urine. °· Have your blood pressure checked. A blood pressure reading is recorded as two numbers, such as "120 over 80" (or 120/80). The first ("top") number is called the systolic pressure. It is a measure of the pressure in your arteries when your heart beats. The second ("bottom") number is called the diastolic pressure. It is a measure of the pressure in your arteries as your heart relaxes between beats. Blood pressure is measured in a unit called mm Hg. A normal blood pressure reading is: °? Systolic: below 120. °? Diastolic: below 80. ° °The type of hypertension that you are diagnosed with depends on your test results and when your symptoms developed. °· Chronic hypertension is usually diagnosed before 20 weeks of pregnancy. °· Gestational hypertension is usually diagnosed after 20 weeks of pregnancy. °· Hypertension with high amounts of protein in the urine is diagnosed as preeclampsia. °· Blood pressure measurements that stay above 160 systolic, or above 110 diastolic, are   signs of severe preeclampsia. ° °How is this treated? °Treatment for hypertension during pregnancy varies depending on the type of hypertension you have and how  serious it is. °· If you take medicines called ACE inhibitors to treat chronic hypertension, you may need to switch medicines. ACE inhibitors should not be taken during pregnancy. °· If you have gestational hypertension, you may need to take blood pressure medicine. °· If you are at risk for preeclampsia, your health care provider may recommend that you take a low-dose aspirin every day to prevent high blood pressure during your pregnancy. °· If you have severe preeclampsia, you may need to be hospitalized so you and your baby can be monitored closely. You may also need to take medicine (magnesium sulfate) to prevent seizures and to lower blood pressure. This medicine may be given as an injection or through an IV tube. °· In some cases, if your condition gets worse, you may need to deliver your baby early. ° °Follow these instructions at home: °Eating and drinking °· Drink enough fluid to keep your urine clear or pale yellow. °· Eat a healthy diet that is low in salt (sodium). Do not add salt to your food. Check food labels to see how much sodium a food or beverage contains. °Lifestyle °· Do not use any products that contain nicotine or tobacco, such as cigarettes and e-cigarettes. If you need help quitting, ask your health care provider. °· Do not use alcohol. °· Avoid caffeine. °· Avoid stress as much as possible. Rest and get plenty of sleep. °General instructions °· Take over-the-counter and prescription medicines only as told by your health care provider. °· While lying down, lie on your left side. This keeps pressure off your baby. °· While sitting or lying down, raise (elevate) your feet. Try putting some pillows under your lower legs. °· Exercise regularly. Ask your health care provider what kinds of exercise are best for you. °· Keep all prenatal and follow-up visits as told by your health care provider. This is important. °Contact a health care provider if: °· You have symptoms that your health care  provider told you may require more treatment or monitoring, such as: °? Fever. °? Vomiting. °? Headache. °Get help right away if: °· You have severe abdominal pain or vomiting that does not get better with treatment. °· You suddenly develop swelling in your hands, ankles, or face. °· You gain 4 lbs (1.8 kg) or more in 1 week. °· You develop vaginal bleeding, or you have blood in your urine. °· You do not feel your baby moving as much as usual. °· You have blurred or double vision. °· You have muscle twitching or sudden tightening (spasms). °· You have shortness of breath. °· Your lips or fingernails turn blue. °This information is not intended to replace advice given to you by your health care provider. Make sure you discuss any questions you have with your health care provider. °Document Released: 11/30/2010 Document Revised: 10/02/2015 Document Reviewed: 08/28/2015 °[employer] Interactive Patient Education © 2018 [employer] ° °

## 2016-11-29 NOTE — H&P (Addendum)
HPI: 36 y/o G3P0111 @ [redacted]w[redacted]d estimated gestational age (as dated by LMP c/w 20 week ultrasound) presents for scheduled IOL for cHTN.   no Leaking of Fluid,   no Vaginal Bleeding,   no Uterine Contractions,  + Fetal Movement.  Prenatal care has been provided by Dr. Nelda Marseille  ROS: no HA, no epigastric pain, no visual changes.    Pregnancy complicated by: 1) chronic HTN- initially on 3 different medications prior to pregnancy, to date currently on Labetalol 100mg  daily -lab work on 9/4: PC ratio 0.14  2) GDMA1- controlled with diet only Last [place] [redacted]w[redacted]d (8/21)- vertex/post/EFW 6#8oz (77%) Followed by NST/BPP weekly, last BPP 8/8 on 9/4   3) Anxiety/Depression- doing well with Lexapro 20mg  daily  4) h/o preterm delivery, s/p Makena 5) Obesity: BMI 41   Prenatal Transfer Tool  Maternal Diabetes: Yes:  Diabetes Type:  Diet controlled Genetic Screening: Normal Maternal Ultrasounds/Referrals: Normal Fetal Ultrasounds or other Referrals:  None Maternal Substance Abuse:  No Significant Maternal Medications:  Meds include: Other:  Significant Maternal Lab Results: Lab values include: Group B Strep negative   PNL:  GBS neg, Rub Immune, Hep B neg, RPR NR, HIV neg, GC/C neg,Blood type: O pos, antibody neg  Immunizations: Tdap: 10/05/16  OBHx: Preterm delivery @ 36wk- due to PPROM, 6#5oz 05/2015- ectopic pregnancy s/p surgical management PMHx:  Chronic HTN, obesity Meds:  PNV, Labetalol, Lexapro Allergy:  No Known Allergies SurgHx: Laparoscopic left salpingectomy due to ectopic pregnancy SocHx:   no Tobacco, no  EtOH, no Illicit Drugs  O: LMP 42/39/5320    Gen. AAOx3, NAD CV.  RRR  No murmur.  Resp. CTAB, no wheeze or crackles. Abd. Gravid,  no tenderness,  no rigidity,  no guarding Extr.  no edema B/L , no calf tenderness, neg Homan's   FHT: 140 bpm, moderate variability  Labs: see orders  A/P:  36 y.o. E3X4356 @ [redacted]w[redacted]d EGA who presents for IOL due to chronic HTN -FWB:   Reassuring -Labor: plan for IOL with cytotec overnight -GBS: negative -Chronic HTN: labs stable on 9/4, no evidence of preeclampsia continue Labetalol 100mg  daily -GDMA1: plan for accucheck q 4hrs -Depression/anxiety: continue Lexapro 20mg  daily  Janyth Pupa, DO [phone] (pager) [phone] (office)

## 2016-11-29 NOTE — MAU Provider Note (Signed)
History     CSN: [id number]  Arrival date and time: 11/29/16 1626   First Provider Initiated Contact with Patient 11/29/16 1707      Chief Complaint  Patient presents with  . Hypertension   Loretta Brown is a 36 y.o. [id number] at [redacted]w[redacted]d who is sent from office visit for further evaluation after having some severe range BPs at her visit. She has CHTN and takes labetalol 100mg  q am but today took it late at 1300 after the visit. She denies headache, visual disturbance, epigastric pain. [place] today was normal per pt. She denies painful contractions, LOF or vaginal bleeding. Good FM.  Pregnancy is also complicated by F6CLE, AMA, smoking and obesity. IOL has been scheduled for 11/30/2016.   OB History  Gravida Para Term Preterm AB Living  3 1   1 1 1   SAB TAB Ectopic Multiple Live Births  0   1   1    # Outcome Date GA Lbr Len/2nd Weight Sex Delivery Anes PTL Lv  3 Current           2 Ectopic 2017          1 Preterm 08/24/08 [redacted]w[redacted]d   F Vag-Spont   LIV     Past Medical History:  Diagnosis Date  . Attention deficit disorder   . Depression   . Hypertension     Past Surgical History:  Procedure Laterality Date  . LAPAROSCOPY N/A 06/23/2015   Procedure: LAPAROSCOPY DIAGNOSTIC, Evacuation Hemoperitoneum;  Surgeon: Janyth Pupa, DO;  Location: [facility];  Service: Gynecology;  Laterality: N/A;  . NO PAST SURGERIES    . UNILATERAL SALPINGECTOMY Left 06/23/2015   Procedure: Left  SALPINGECTOMY with Removal Ectopic Pregnancy;  Surgeon: Janyth Pupa, DO;  Location: [facility];  Service: Gynecology;  Laterality: Left;    Family History  Problem Relation Age of Onset  . Hypertension Mother   . Hypertension Father   . Diabetes Paternal Grandfather     Social History  Substance Use Topics  . Smoking status: Current Every Day Smoker  . Smokeless tobacco: Never Used  . Alcohol use Yes    Allergies: No Known Allergies  Prescriptions Prior to Admission  Medication Sig Dispense Refill Last Dose   . amphetamine-dextroamphetamine (ADDERALL) 20 MG tablet Take 20 mg by mouth 2 (two) times daily.   Not Taking  . buPROPion (WELLBUTRIN XL) 300 MG 24 hr tablet Take 300 mg by mouth daily.   Not Taking  . cyclobenzaprine (FLEXERIL) 5 MG tablet Take 1 tablet (5 mg total) by mouth 3 (three) times daily as needed for muscle spasms. (Patient not taking: Reported on 06/21/2015) 30 tablet 0 Not Taking  . docusate sodium (COLACE) 100 MG capsule Take 1 capsule (100 mg total) by mouth 2 (two) times daily. (Patient not taking: Reported on 10/14/2016) 30 capsule 0 Not Taking  . DULoxetine HCl (CYMBALTA PO) Take 1 tablet by mouth daily.   Not Taking  . Escitalopram Oxalate (LEXAPRO PO) Take by mouth.   Taking  . glycopyrrolate (ROBINUL) 1 MG tablet Take 0.5-1 mg by mouth 2 (two) times daily.   Not Taking  . hydrALAZINE (APRESOLINE) 25 MG tablet Take 25 mg by mouth 2 (two) times daily.   Not Taking  . hydrochlorothiazide (HYDRODIURIL) 25 MG tablet Take 25 mg by mouth daily.   Not Taking  . HYDROcodone-acetaminophen (NORCO/VICODIN) 5-325 MG tablet Take 1 tablet by mouth every 6 (six) hours as needed for moderate pain. (Patient not taking:  Reported on 10/14/2016) 15 tablet 0 Not Taking  . hydroxyprogesterone caproate (MAKENA) 250 mg/mL OIL injection Inject 250 mg into the muscle once.   Taking  . ibuprofen (ADVIL,MOTRIN) 600 MG tablet Take 1 tablet (600 mg total) by mouth every 6 (six) hours as needed. (Patient not taking: Reported on 10/14/2016) 30 tablet 0 Not Taking  . ibuprofen (ADVIL,MOTRIN) 800 MG tablet Take 1 tablet (800 mg total) by mouth 3 (three) times daily. (Patient not taking: Reported on 06/21/2015) 21 tablet 0 Not Taking  . labetalol (NORMODYNE) 100 MG tablet Take 100 mg by mouth 2 (two) times daily.   Taking  . Prenatal Vit-Fe Fumarate-FA (PRENATAL VITAMIN PO) Take by mouth.   Taking    Review of Systems  Constitutional: Negative for fatigue and fever.  Respiratory: Negative.   Cardiovascular:  Negative.   Genitourinary: Negative for dysuria, pelvic pain, vaginal bleeding and vaginal discharge.  Psychiatric/Behavioral: The patient is not nervous/anxious.    Physical Exam   Blood pressure 135/88, pulse 97, temperature 98.3 F (36.8 C), temperature source Oral, resp. rate 16, height 5\' 3"  (1.6 m), weight 236 lb 8 oz (107.3 kg), last menstrual period 03/16/2016, unknown if currently breastfeeding.  Vitals:   11/29/16 1731 11/29/16 1746 11/29/16 1801 11/29/16 1816  BP: 139/85 (!) 137/94 (!) 143/99 (!) 131/94  Pulse: 86 88 80 80  Resp:      Temp:      TempSrc:      SpO2:      Weight:      Height:       BP range: 134-143/88-99  Physical Exam  Nursing note and vitals reviewed. Constitutional: She is oriented to person, place, and time. She appears well-developed and well-nourished. No distress.  HENT:  Head: Normocephalic.  Eyes: No scleral icterus.  Neck: Neck supple.  Cardiovascular: Normal rate.   Respiratory: Effort normal.  GI: Soft. There is no tenderness.  Musculoskeletal: She exhibits no tenderness.  Neurological: She is alert and oriented to person, place, and time. She has normal reflexes.  Skin: Skin is warm and dry.  Psychiatric: She has a normal mood and affect. Her behavior is normal. Thought content normal.    MAU Course  Procedures Results for orders placed or performed during the hospital encounter of 11/29/16 (from the past 24 hour(s))  Urinalysis, Routine w reflex microscopic     Status: Abnormal   Collection Time: 11/29/16  4:26 PM  Result Value Ref Range   Color, Urine YELLOW YELLOW   APPearance TURBID (A) CLEAR   Specific Gravity, Urine 1.016 1.005 - 1.030   pH 6.0 5.0 - 8.0   Glucose, UA NEGATIVE NEGATIVE mg/dL   Hgb urine dipstick NEGATIVE NEGATIVE   Bilirubin Urine NEGATIVE NEGATIVE   Ketones, ur NEGATIVE NEGATIVE mg/dL   Protein, ur 30 (A) NEGATIVE mg/dL   Nitrite NEGATIVE NEGATIVE   Leukocytes, UA MODERATE (A) NEGATIVE   RBC / HPF  0-5 0 - 5 RBC/hpf   WBC, UA 6-30 0 - 5 WBC/hpf   Bacteria, UA MANY (A) NONE SEEN   Squamous Epithelial / LPF 6-30 (A) NONE SEEN   Mucus PRESENT   Protein / creatinine ratio, urine     Status: None   Collection Time: 11/29/16  4:26 PM  Result Value Ref Range   Creatinine, Urine 222.00 mg/dL   Total Protein, Urine 29 mg/dL   Protein Creatinine Ratio 0.13 0.00 - 0.15 mg/mg[Cre]  Comprehensive metabolic panel     Status: Abnormal  Collection Time: 11/29/16  5:45 PM  Result Value Ref Range   Sodium 136 135 - 145 mmol/L   Potassium 3.9 3.5 - 5.1 mmol/L   Chloride 103 101 - 111 mmol/L   CO2 24 22 - 32 mmol/L   Glucose, Bld 93 65 - 99 mg/dL   BUN 9 6 - 20 mg/dL   Creatinine, Ser 0.78 0.44 - 1.00 mg/dL   Calcium 8.7 (L) 8.9 - 10.3 mg/dL   Total Protein 6.2 (L) 6.5 - 8.1 g/dL   Albumin 2.9 (L) 3.5 - 5.0 g/dL   AST 21 15 - 41 U/L   ALT 12 (L) 14 - 54 U/L   Alkaline Phosphatase 111 38 - 126 U/L   Total Bilirubin 0.4 0.3 - 1.2 mg/dL   GFR calc non Af Amer >60 >60 mL/min   GFR calc Af Amer >60 >60 mL/min   Anion gap 9 5 - 15  CBC     Status: Abnormal   Collection Time: 11/29/16  5:45 PM  Result Value Ref Range   WBC 11.2 (H) 4.0 - 10.5 K/uL   RBC 3.79 (L) 3.87 - 5.11 MIL/uL   Hemoglobin 11.4 (L) 12.0 - 15.0 g/dL   HCT 33.5 (L) 36.0 - 46.0 %   MCV 88.4 78.0 - 100.0 fL   MCH 30.1 26.0 - 34.0 pg   MCHC 34.0 30.0 - 36.0 g/dL   RDW 14.8 11.5 - 15.5 %   Platelets 368 150 - 400 K/uL   Fetal monitoring Baseline 125, moderate variability, accelerations present, no decelerations Toco: No contractions  C/W Dr. Simona Huh Discharge home with preeclampsia precautions and return as scheduled for IOL 11/30/16  Assessment and Plan  36yo [id number] at [redacted]w[redacted]d FWB by EFM 1. Elevated blood pressure complicating pregnancy, antepartum, third trimester   2. Chronic hypertension during pregnancy, antepartum   3. Diet controlled gestational diabetes mellitus (GDM) in third trimester    Allergies as of  11/29/2016   No Known Allergies     Medication List    STOP taking these medications   escitalopram 20 MG tablet Commonly known as:  LEXAPRO   hydroxyprogesterone caproate 250 mg/mL Oil injection Commonly known as:  MAKENA     TAKE these medications   labetalol 100 MG tablet Commonly known as:  NORMODYNE Take 100 mg by mouth daily.   PRENATAL VITAMIN PO Take 1 tablet by mouth 2 (two) times daily.      Follow-up Information    Janyth Pupa, DO Follow up on 11/30/2016.   Specialty:  Obstetrics and Gynecology Why:  Return for induction of labor as scheduled Contact information: [address] [place] [address] [phone]           Candis Musa CNM 11/29/2016, 5:09 PM

## 2016-11-29 NOTE — MAU Note (Signed)
Pt was sent from the doctors office for increased blood pressures.  Pt is chronic hypertensive and did not take her medications today.

## 2016-12-01 ENCOUNTER — Encounter (HOSPITAL_COMMUNITY): Payer: Self-pay

## 2016-12-01 ENCOUNTER — Encounter (HOSPITAL_COMMUNITY)
Admission: RE | Disposition: A | Discharge status: Home / Self Care | Payer: Self-pay | Source: Ambulatory Visit | Attending: Obstetrics & Gynecology

## 2016-12-01 ENCOUNTER — Inpatient Hospital Stay (HOSPITAL_COMMUNITY): Payer: 59 | Admitting: Anesthesiology

## 2016-12-01 ENCOUNTER — Inpatient Hospital Stay (HOSPITAL_COMMUNITY)
Admission: RE | Admit: 2016-12-01 | Discharge: 2016-12-06 | DRG: 765 | Disposition: A | Discharge status: Home / Self Care | Payer: 59 | Source: Ambulatory Visit | Attending: Obstetrics & Gynecology | Admitting: Obstetrics & Gynecology

## 2016-12-01 VITALS — BP 109/59 | HR 79 | Temp 98.7°F | Resp 18 | Ht 63.0 in | Wt 236.0 lb

## 2016-12-01 DIAGNOSIS — F17200 Nicotine dependence, unspecified, uncomplicated: Secondary | ICD-10-CM | POA: Diagnosis present

## 2016-12-01 DIAGNOSIS — F419 Anxiety disorder, unspecified: Secondary | ICD-10-CM | POA: Diagnosis present

## 2016-12-01 DIAGNOSIS — O99344 Other mental disorders complicating childbirth: Secondary | ICD-10-CM | POA: Diagnosis present

## 2016-12-01 DIAGNOSIS — O10919 Unspecified pre-existing hypertension complicating pregnancy, unspecified trimester: Secondary | ICD-10-CM | POA: Diagnosis present

## 2016-12-01 DIAGNOSIS — O1002 Pre-existing essential hypertension complicating childbirth: Secondary | ICD-10-CM | POA: Diagnosis present

## 2016-12-01 DIAGNOSIS — O99214 Obesity complicating childbirth: Secondary | ICD-10-CM | POA: Diagnosis present

## 2016-12-01 DIAGNOSIS — O24420 Gestational diabetes mellitus in childbirth, diet controlled: Secondary | ICD-10-CM | POA: Diagnosis present

## 2016-12-01 DIAGNOSIS — O4593 Premature separation of placenta, unspecified, third trimester: Secondary | ICD-10-CM | POA: Diagnosis present

## 2016-12-01 DIAGNOSIS — Z3A37 37 weeks gestation of pregnancy: Secondary | ICD-10-CM | POA: Diagnosis not present

## 2016-12-01 DIAGNOSIS — O114 Pre-existing hypertension with pre-eclampsia, complicating childbirth: Principal | ICD-10-CM | POA: Diagnosis present

## 2016-12-01 DIAGNOSIS — Z98891 History of uterine scar from previous surgery: Secondary | ICD-10-CM

## 2016-12-01 DIAGNOSIS — O99334 Smoking (tobacco) complicating childbirth: Secondary | ICD-10-CM | POA: Diagnosis present

## 2016-12-01 DIAGNOSIS — F329 Major depressive disorder, single episode, unspecified: Secondary | ICD-10-CM | POA: Diagnosis present

## 2016-12-01 DIAGNOSIS — Z6841 Body Mass Index (BMI) 40.0 and over, adult: Secondary | ICD-10-CM

## 2016-12-01 LAB — CBC
HCT: 35.7 % — ABNORMAL LOW (ref 36.0–46.0)
HEMATOCRIT: 34.3 % — AB (ref 36.0–46.0)
Hemoglobin: 11.8 g/dL — ABNORMAL LOW (ref 12.0–15.0)
Hemoglobin: 12 g/dL (ref 12.0–15.0)
MCH: 29.9 pg (ref 26.0–34.0)
MCH: 29.8 pg (ref 26.0–34.0)
MCHC: 33.6 g/dL (ref 30.0–36.0)
MCHC: 34.4 g/dL (ref 30.0–36.0)
MCV: 86.8 fL (ref 78.0–100.0)
MCV: 88.6 fL (ref 78.0–100.0)
PLATELETS: 374 10*3/uL (ref 150–400)
Platelets: 396 10*3/uL (ref 150–400)
RBC: 3.95 MIL/uL (ref 3.87–5.11)
RBC: 4.03 MIL/uL (ref 3.87–5.11)
RDW: 14.9 % (ref 11.5–15.5)
RDW: 15 % (ref 11.5–15.5)
WBC: 9.8 10*3/uL (ref 4.0–10.5)
WBC: 13.1 10*3/uL — AB (ref 4.0–10.5)

## 2016-12-01 LAB — GLUCOSE, CAPILLARY
GLUCOSE-CAPILLARY: 86 mg/dL (ref 65–99)
GLUCOSE-CAPILLARY: 90 mg/dL (ref 65–99)
Glucose-Capillary: 140 mg/dL — ABNORMAL HIGH (ref 65–99)

## 2016-12-01 LAB — TYPE AND SCREEN
ABO/RH(D): O POS
Antibody Screen: NEGATIVE

## 2016-12-01 LAB — RPR: RPR Ser Ql: NONREACTIVE

## 2016-12-01 SURGERY — Surgical Case
Anesthesia: Spinal | Site: Abdomen | Wound class: Clean Contaminated

## 2016-12-01 MED ORDER — LACTATED RINGERS IV SOLN: 500.0000 mL | INTRAVENOUS | Status: DC | PRN

## 2016-12-01 MED ORDER — LACTATED RINGERS IV SOLN
INTRAVENOUS | Status: DC
  Administered 2016-12-02 – 2016-12-06 (×3): via INTRAVENOUS

## 2016-12-01 MED ORDER — OXYTOCIN 40 UNITS IN LACTATED RINGERS INFUSION - SIMPLE MED
1.0000 m[IU]/min | INTRAVENOUS | Status: DC
  Administered 2016-12-01: 2 m[IU]/min via INTRAVENOUS
  Filled 2016-12-01: qty 1000

## 2016-12-01 MED ORDER — SIMETHICONE 80 MG PO CHEW: 80.0000 mg | CHEWABLE_TABLET | ORAL | Status: DC | PRN

## 2016-12-01 MED ORDER — LACTATED RINGERS IV SOLN
INTRAVENOUS | Status: DC | PRN
  Administered 2016-12-01: 40 [IU] via INTRAVENOUS

## 2016-12-01 MED ORDER — OXYTOCIN 40 UNITS IN LACTATED RINGERS INFUSION - SIMPLE MED: 2.5000 [IU]/h | INTRAVENOUS | Status: AC

## 2016-12-01 MED ORDER — ONDANSETRON HCL 4 MG/2ML IJ SOLN
INTRAMUSCULAR | Status: AC
  Filled 2016-12-01: qty 2

## 2016-12-01 MED ORDER — METHYLERGONOVINE MALEATE 0.2 MG PO TABS: 0.2000 mg | ORAL_TABLET | ORAL | Status: DC | PRN

## 2016-12-01 MED ORDER — LACTATED RINGERS IV SOLN
INTRAVENOUS | Status: DC
  Administered 2016-12-01 (×4): via INTRAVENOUS

## 2016-12-01 MED ORDER — SCOPOLAMINE 1 MG/3DAYS TD PT72
MEDICATED_PATCH | TRANSDERMAL | Status: DC | PRN
  Administered 2016-12-01: 1 via TRANSDERMAL

## 2016-12-01 MED ORDER — METHYLERGONOVINE MALEATE 0.2 MG/ML IJ SOLN: 0.2000 mg | INTRAMUSCULAR | Status: DC | PRN

## 2016-12-01 MED ORDER — FENTANYL 2.5 MCG/ML BUPIVACAINE 1/10 % EPIDURAL INFUSION (WH - ANES)
14.0000 mL/h | INTRAMUSCULAR | Status: DC | PRN
  Filled 2016-12-01: qty 100

## 2016-12-01 MED ORDER — KETOROLAC TROMETHAMINE 30 MG/ML IJ SOLN: 30.0000 mg | Freq: Once | INTRAMUSCULAR | Status: DC | PRN

## 2016-12-01 MED ORDER — NIFEDIPINE 10 MG PO CAPS
10.0000 mg | ORAL_CAPSULE | ORAL | Status: DC | PRN
  Filled 2016-12-01 (×2): qty 2

## 2016-12-01 MED ORDER — PHENYLEPHRINE 40 MCG/ML (10ML) SYRINGE FOR IV PUSH (FOR BLOOD PRESSURE SUPPORT): 80.0000 ug | PREFILLED_SYRINGE | INTRAVENOUS | Status: DC | PRN

## 2016-12-01 MED ORDER — DEXAMETHASONE SODIUM PHOSPHATE 10 MG/ML IJ SOLN
INTRAMUSCULAR | Status: AC
  Filled 2016-12-01: qty 1

## 2016-12-01 MED ORDER — PHENYLEPHRINE HCL 10 MG/ML IJ SOLN
INTRAMUSCULAR | Status: DC | PRN
  Administered 2016-12-01: 120 ug via INTRAVENOUS

## 2016-12-01 MED ORDER — KETOROLAC TROMETHAMINE 30 MG/ML IJ SOLN
INTRAMUSCULAR | Status: AC
  Filled 2016-12-01: qty 1

## 2016-12-01 MED ORDER — MEPERIDINE HCL 25 MG/ML IJ SOLN: 6.2500 mg | INTRAMUSCULAR | Status: DC | PRN

## 2016-12-01 MED ORDER — PRENATAL MULTIVITAMIN CH
1.0000 | ORAL_TABLET | Freq: Every day | ORAL | Status: DC
  Administered 2016-12-02 – 2016-12-06 (×5): 1 via ORAL
  Filled 2016-12-01 (×5): qty 1

## 2016-12-01 MED ORDER — ONDANSETRON HCL 4 MG/2ML IJ SOLN
INTRAMUSCULAR | Status: DC | PRN
  Administered 2016-12-01: 4 mg via INTRAVENOUS

## 2016-12-01 MED ORDER — IBUPROFEN 600 MG PO TABS
600.0000 mg | ORAL_TABLET | Freq: Four times a day (QID) | ORAL | Status: DC
  Administered 2016-12-02 – 2016-12-06 (×18): 600 mg via ORAL
  Filled 2016-12-01 (×18): qty 1

## 2016-12-01 MED ORDER — EPHEDRINE 5 MG/ML INJ: 10.0000 mg | INTRAVENOUS | Status: DC | PRN

## 2016-12-01 MED ORDER — OXYCODONE-ACETAMINOPHEN 5-325 MG PO TABS: 1.0000 | ORAL_TABLET | ORAL | Status: DC | PRN

## 2016-12-01 MED ORDER — OXYCODONE HCL 5 MG PO TABS
5.0000 mg | ORAL_TABLET | ORAL | Status: DC | PRN
  Administered 2016-12-01 – 2016-12-04 (×5): 5 mg via ORAL
  Filled 2016-12-01 (×5): qty 1

## 2016-12-01 MED ORDER — PHENYLEPHRINE 8 MG IN D5W 100 ML (0.08MG/ML) PREMIX OPTIME
INJECTION | INTRAVENOUS | Status: DC | PRN
  Administered 2016-12-01: 60 ug/min via INTRAVENOUS

## 2016-12-01 MED ORDER — HYDROMORPHONE HCL 1 MG/ML IJ SOLN
1.0000 mg | Freq: Once | INTRAMUSCULAR | Status: DC
Start: 2016-12-01 — End: 2016-12-06

## 2016-12-01 MED ORDER — PHENYLEPHRINE 40 MCG/ML (10ML) SYRINGE FOR IV PUSH (FOR BLOOD PRESSURE SUPPORT)
80.0000 ug | PREFILLED_SYRINGE | INTRAVENOUS | Status: DC | PRN
  Filled 2016-12-01: qty 10

## 2016-12-01 MED ORDER — PROMETHAZINE HCL 25 MG/ML IJ SOLN: 6.2500 mg | INTRAMUSCULAR | Status: DC | PRN

## 2016-12-01 MED ORDER — DIBUCAINE 1 % RE OINT: 1.0000 "application " | TOPICAL_OINTMENT | RECTAL | Status: DC | PRN

## 2016-12-01 MED ORDER — NIFEDIPINE 10 MG PO CAPS
10.0000 mg | ORAL_CAPSULE | ORAL | Status: DC | PRN
  Administered 2016-12-01: 10 mg via ORAL
  Filled 2016-12-01 (×2): qty 2

## 2016-12-01 MED ORDER — ACETAMINOPHEN 325 MG PO TABS: 650.0000 mg | ORAL_TABLET | ORAL | Status: DC | PRN

## 2016-12-01 MED ORDER — ZOLPIDEM TARTRATE 5 MG PO TABS: 5.0000 mg | ORAL_TABLET | Freq: Every evening | ORAL | Status: DC | PRN

## 2016-12-01 MED ORDER — SIMETHICONE 80 MG PO CHEW
80.0000 mg | CHEWABLE_TABLET | Freq: Three times a day (TID) | ORAL | Status: DC
  Administered 2016-12-02 – 2016-12-06 (×11): 80 mg via ORAL
  Filled 2016-12-01 (×12): qty 1

## 2016-12-01 MED ORDER — MEPERIDINE HCL 25 MG/ML IJ SOLN
INTRAMUSCULAR | Status: AC
  Filled 2016-12-01: qty 1

## 2016-12-01 MED ORDER — ESCITALOPRAM OXALATE 20 MG PO TABS
20.0000 mg | ORAL_TABLET | Freq: Every day | ORAL | Status: DC
  Administered 2016-12-02 – 2016-12-06 (×5): 20 mg via ORAL
  Filled 2016-12-01 (×8): qty 1

## 2016-12-01 MED ORDER — LIDOCAINE HCL (PF) 1 % IJ SOLN
30.0000 mL | INTRAMUSCULAR | Status: DC | PRN
  Filled 2016-12-01: qty 30

## 2016-12-01 MED ORDER — OXYTOCIN BOLUS FROM INFUSION: 500.0000 mL | Freq: Once | INTRAVENOUS | Status: DC

## 2016-12-01 MED ORDER — LACTATED RINGERS IV SOLN: 500.0000 mL | Freq: Once | INTRAVENOUS | Status: DC

## 2016-12-01 MED ORDER — COCONUT OIL OIL
1.0000 "application " | TOPICAL_OIL | Status: DC | PRN
  Filled 2016-12-01: qty 120

## 2016-12-01 MED ORDER — MISOPROSTOL 25 MCG QUARTER TABLET
25.0000 ug | ORAL_TABLET | ORAL | Status: DC | PRN
  Administered 2016-12-01 (×2): 25 ug via VAGINAL
  Filled 2016-12-01 (×3): qty 1

## 2016-12-01 MED ORDER — DIPHENHYDRAMINE HCL 25 MG PO CAPS: 25.0000 mg | ORAL_CAPSULE | Freq: Four times a day (QID) | ORAL | Status: DC | PRN

## 2016-12-01 MED ORDER — SOD CITRATE-CITRIC ACID 500-334 MG/5ML PO SOLN: 30.0000 mL | ORAL | Status: DC | PRN

## 2016-12-01 MED ORDER — ONDANSETRON HCL 4 MG/2ML IJ SOLN: 4.0000 mg | Freq: Four times a day (QID) | INTRAMUSCULAR | Status: DC | PRN

## 2016-12-01 MED ORDER — DEXAMETHASONE SODIUM PHOSPHATE 4 MG/ML IJ SOLN
INTRAMUSCULAR | Status: DC | PRN
  Administered 2016-12-01: 4 mg via INTRAVENOUS

## 2016-12-01 MED ORDER — MEPERIDINE HCL 25 MG/ML IJ SOLN
INTRAMUSCULAR | Status: DC | PRN
  Administered 2016-12-01 (×2): 12.5 mg via INTRAVENOUS

## 2016-12-01 MED ORDER — LABETALOL HCL 100 MG PO TABS
100.0000 mg | ORAL_TABLET | Freq: Every day | ORAL | Status: DC
  Administered 2016-12-01: 100 mg via ORAL
  Filled 2016-12-01: qty 1

## 2016-12-01 MED ORDER — OXYTOCIN 40 UNITS IN LACTATED RINGERS INFUSION - SIMPLE MED: 2.5000 [IU]/h | INTRAVENOUS | Status: DC

## 2016-12-01 MED ORDER — WITCH HAZEL-GLYCERIN EX PADS: 1.0000 "application " | MEDICATED_PAD | CUTANEOUS | Status: DC | PRN

## 2016-12-01 MED ORDER — OXYCODONE-ACETAMINOPHEN 5-325 MG PO TABS: 2.0000 | ORAL_TABLET | ORAL | Status: DC | PRN

## 2016-12-01 MED ORDER — MORPHINE SULFATE (PF) 0.5 MG/ML IJ SOLN
INTRAMUSCULAR | Status: AC
  Filled 2016-12-01: qty 10

## 2016-12-01 MED ORDER — OXYCODONE HCL 5 MG PO TABS
10.0000 mg | ORAL_TABLET | ORAL | Status: DC | PRN
  Administered 2016-12-01 – 2016-12-03 (×5): 10 mg via ORAL
  Filled 2016-12-01 (×6): qty 2

## 2016-12-01 MED ORDER — DEXTROSE 5 % IV SOLN
500.0000 mg | Freq: Once | INTRAVENOUS | Status: AC
  Administered 2016-12-01: 500 mg via INTRAVENOUS
  Filled 2016-12-01: qty 500

## 2016-12-01 MED ORDER — FENTANYL CITRATE (PF) 100 MCG/2ML IJ SOLN
INTRAMUSCULAR | Status: AC
  Filled 2016-12-01: qty 2

## 2016-12-01 MED ORDER — CEFAZOLIN SODIUM-DEXTROSE 2-3 GM-% IV SOLR
INTRAVENOUS | Status: DC | PRN
  Administered 2016-12-01: 2 g via INTRAVENOUS

## 2016-12-01 MED ORDER — DIPHENHYDRAMINE HCL 50 MG/ML IJ SOLN: 12.5000 mg | INTRAMUSCULAR | Status: DC | PRN

## 2016-12-01 MED ORDER — SENNOSIDES-DOCUSATE SODIUM 8.6-50 MG PO TABS
2.0000 | ORAL_TABLET | ORAL | Status: DC
  Administered 2016-12-02 – 2016-12-06 (×5): 2 via ORAL
  Filled 2016-12-01 (×5): qty 2

## 2016-12-01 MED ORDER — LACTATED RINGERS IV SOLN
INTRAVENOUS | Status: DC | PRN
  Administered 2016-12-01: 12:00:00 via INTRAVENOUS

## 2016-12-01 MED ORDER — TERBUTALINE SULFATE 1 MG/ML IJ SOLN
0.2500 mg | Freq: Once | INTRAMUSCULAR | Status: DC | PRN
  Filled 2016-12-01: qty 1

## 2016-12-01 MED ORDER — LABETALOL HCL 5 MG/ML IV SOLN: 40.0000 mg | Freq: Once | INTRAVENOUS | Status: DC | PRN

## 2016-12-01 MED ORDER — SODIUM CHLORIDE 0.9 % IR SOLN
Status: DC | PRN
  Administered 2016-12-01: 1000 mL

## 2016-12-01 MED ORDER — BUPIVACAINE IN DEXTROSE 0.75-8.25 % IT SOLN
INTRATHECAL | Status: DC | PRN
  Administered 2016-12-01: 1.4 mL via INTRATHECAL

## 2016-12-01 MED ORDER — LABETALOL HCL 100 MG PO TABS: 100.0000 mg | ORAL_TABLET | Freq: Every day | ORAL | Status: DC

## 2016-12-01 MED ORDER — HYDROMORPHONE HCL 1 MG/ML IJ SOLN: 0.2500 mg | INTRAMUSCULAR | Status: DC | PRN

## 2016-12-01 MED ORDER — ACETAMINOPHEN 325 MG PO TABS
650.0000 mg | ORAL_TABLET | ORAL | Status: DC | PRN
  Administered 2016-12-01 – 2016-12-05 (×8): 650 mg via ORAL
  Filled 2016-12-01 (×9): qty 2

## 2016-12-01 MED ORDER — SIMETHICONE 80 MG PO CHEW
80.0000 mg | CHEWABLE_TABLET | ORAL | Status: DC
  Administered 2016-12-02 – 2016-12-06 (×5): 80 mg via ORAL
  Filled 2016-12-01 (×5): qty 1

## 2016-12-01 MED ORDER — SCOPOLAMINE 1 MG/3DAYS TD PT72
MEDICATED_PATCH | TRANSDERMAL | Status: AC
  Filled 2016-12-01: qty 1

## 2016-12-01 MED ORDER — MENTHOL 3 MG MT LOZG: 1.0000 | LOZENGE | OROMUCOSAL | Status: DC | PRN

## 2016-12-01 MED ORDER — ESCITALOPRAM OXALATE 20 MG PO TABS: 20.0000 mg | ORAL_TABLET | Freq: Every day | ORAL | Status: DC

## 2016-12-01 SURGICAL SUPPLY — 35 items
BENZOIN TINCTURE PRP APPL 2/3 (GAUZE/BANDAGES/DRESSINGS) ×3 IMPLANT
CHLORAPREP W/TINT 26ML (MISCELLANEOUS) ×3 IMPLANT
CLAMP CORD UMBIL (MISCELLANEOUS) ×3 IMPLANT
CLOSURE WOUND 1/2 X4 (GAUZE/BANDAGES/DRESSINGS) ×1
CLOTH BEACON ORANGE TIMEOUT ST (SAFETY) ×3 IMPLANT
DRSG OPSITE POSTOP 4X10 (GAUZE/BANDAGES/DRESSINGS) ×3 IMPLANT
ELECT REM PT RETURN 9FT ADLT (ELECTROSURGICAL) ×3
ELECTRODE REM PT RTRN 9FT ADLT (ELECTROSURGICAL) ×1 IMPLANT
GLOVE BIOGEL PI IND STRL 6.5 (GLOVE) ×1 IMPLANT
GLOVE BIOGEL PI IND STRL 7.0 (GLOVE) ×2 IMPLANT
GLOVE BIOGEL PI INDICATOR 6.5 (GLOVE) ×2
GLOVE BIOGEL PI INDICATOR 7.0 (GLOVE) ×4
GLOVE ECLIPSE 6.5 STRL STRAW (GLOVE) ×3 IMPLANT
GOWN STRL REUS W/TWL LRG LVL3 (GOWN DISPOSABLE) ×9 IMPLANT
HEMOSTAT SURGICEL 4X8 (HEMOSTASIS) ×3 IMPLANT
KIT ABG SYR 3ML LUER SLIP (SYRINGE) ×3 IMPLANT
NEEDLE HYPO 25X5/8 SAFETYGLIDE (NEEDLE) ×3 IMPLANT
NS IRRIG 1000ML POUR BTL (IV SOLUTION) ×3 IMPLANT
PACK C SECTION WH (CUSTOM PROCEDURE TRAY) ×3 IMPLANT
PAD ABD 7.5X8 STRL (GAUZE/BANDAGES/DRESSINGS) ×3 IMPLANT
PAD OB MATERNITY 4.3X12.25 (PERSONAL CARE ITEMS) ×3 IMPLANT
PENCIL SMOKE EVAC W/HOLSTER (ELECTROSURGICAL) ×3 IMPLANT
RTRCTR C-SECT PINK 25CM LRG (MISCELLANEOUS) ×3 IMPLANT
SPONGE LAP 18X18 X RAY DECT (DISPOSABLE) ×6 IMPLANT
STRIP CLOSURE SKIN 1/2X4 (GAUZE/BANDAGES/DRESSINGS) ×2 IMPLANT
SUT PLAIN 2 0 XLH (SUTURE) ×3 IMPLANT
SUT VIC AB 0 CT1 27 (SUTURE) ×6
SUT VIC AB 0 CT1 27XBRD ANBCTR (SUTURE) ×3 IMPLANT
SUT VIC AB 0 CTX 36 (SUTURE) ×10
SUT VIC AB 0 CTX36XBRD ANBCTRL (SUTURE) ×5 IMPLANT
SUT VIC AB 2-0 CT1 27 (SUTURE) ×2
SUT VIC AB 2-0 CT1 TAPERPNT 27 (SUTURE) ×1 IMPLANT
SUT VIC AB 4-0 KS 27 (SUTURE) ×3 IMPLANT
TOWEL OR 17X24 6PK STRL BLUE (TOWEL DISPOSABLE) ×3 IMPLANT
TRAY FOLEY BAG SILVER LF 14FR (SET/KITS/TRAYS/PACK) ×3 IMPLANT

## 2016-12-01 NOTE — Progress Notes (Signed)
OB PN:  S: Pt resting comfortably, no headache, no blurry vision.  Starting to feel some mild contractions.  O: BP (!) 148/95   Pulse 72   Temp 98.3 F (36.8 C) (Oral)   Resp 19   Ht 5\' 3"  (1.6 m)   Wt 236 lb (107 kg)   LMP 03/16/2016   BMI 41.81 kg/m   FHT: 120bpm, moderate variablity, + accels, no decels Toco: irregular SVE: deferred, last exam 2/60/-3  A/P: 36 y.o. [id number] @ [redacted]w[redacted]d for IOL for chronic HTN 1. FWB: Cat. I 2. Labor: s/p cytotec x2, plan to transition to Pitocin this am following shower and very light breakfast Pain: IV or epidural if desired GBS: neg GDMA1: continue accuchecks q 4hr CHTN: BP normal limits, continue Labetalol 100mg  daily, plan to closely monitor, Procardia protocol written if elevated BP Dep/anxiety: continue Lexapro 20mg  daily  Janyth Pupa, DO [phone] (pager) [phone] (office)

## 2016-12-01 NOTE — Progress Notes (Addendum)
Postoperative Note Day # 0  S:  Patient resting comfortable in bed.  Working on pain management- pt just received 2nd tablet of oxycodone.  Tolerating sips/chips. No flatus, no BM.  Lochia minimal.  Not yet ambulating.  Foley in place. No headache, no blurry vision, no RUQ pain.  O: Temp:  [97.7 F (36.5 C)-98.6 F (37 C)] 98 F (36.7 C) (09/06 1738) Pulse Rate:  [72-111] 88 (09/06 1738) Resp:  [15-25] 15 (09/06 1738) BP: (125-152)/(76-102) 152/102 (09/06 1738) SpO2:  [94 %-100 %] 98 % (09/06 1738) Weight:  [236 lb (107 kg)] 236 lb (107 kg) (09/06 0033)   Gen: A&Ox3, NAD CV: RRR Resp: CTAB Abdomen: soft, ND, appropriately tender Uterus: firm, non-tender, below umbilicus Incision: c/d/i, bandage on Ext: No edema, no calf tenderness bilaterally, SCDs in place  A/P: Pt is a 36 y.o. [id number] s/p primary C-section due to fetal bradycardia with uterine rupture, POD#0  - plan for Dilaudid 1mg  IV once, if no improvement in pain within the next 76min then plan for more round the clock pain management -GU: UOP is adequate, foley in place -GI: advance diet as tolerated -Activity: encouraged sitting up to chair and ambulation as tolerated -Prophylaxis: SCDs while in bed -Labs: CBC in am  -chronic HTN- continue Labetalol 100mg  daily, BP elevated currently- likely due to pain management.  S/p IV procardia x1 in PACU, will continue to monitor and follow protocol.  If persistently elevated despite pain management, will plan for repeat preeclampsia labs  -GDMA1- fasting accucheck in am and before meals  -Dep/anxiety: continue Lexapro 20mg  daily  Janyth Pupa, DO [phone] (pager) [phone] (office)

## 2016-12-01 NOTE — Anesthesia Preprocedure Evaluation (Signed)
Anesthesia Evaluation  Patient identified by MRN, date of birth, ID band Patient awake    Reviewed: Allergy & Precautions  Airway Mallampati: III       Dental   Pulmonary Current Smoker,    Pulmonary exam normal        Cardiovascular hypertension, Normal cardiovascular exam     Neuro/Psych    GI/Hepatic   Endo/Other  Morbid obesity  Renal/GU      Musculoskeletal   Abdominal (+) + obese,   Peds  Hematology   Anesthesia Other Findings   Reproductive/Obstetrics                             Anesthesia Physical Anesthesia Plan  ASA: III and emergent  Anesthesia Plan: Spinal   Post-op Pain Management:    Induction:   PONV Risk Score and Plan: 3 and Ondansetron, Dexamethasone, Midazolam and Scopolamine patch - Pre-op  Airway Management Planned: Natural Airway, Nasal Cannula and Simple Face Mask  Additional Equipment:   Intra-op Plan:   Post-operative Plan:   Informed Consent:   Plan Discussed with: CRNA and Surgeon  Anesthesia Plan Comments:         Anesthesia Quick Evaluation

## 2016-12-01 NOTE — Lactation Note (Signed)
This note was copied from a baby's chart. Lactation Consultation Note  Patient Name: Loretta Brown Date: 12/01/2016 Reason for consult: Initial assessment;NICU baby  NICU baby 92 hours old. Returned to assist mom with pumping, but mom getting ready to be transported to NICU to see baby. Will attempt to return after mom back in her room.  Maternal Data Does the patient have breastfeeding experience prior to this delivery?: Yes  Feeding    LATCH Score                   Interventions    Lactation Tools Discussed/Used Tools: Pump Breast pump type: Double-Electric Breast Pump Pump Review: Setup, frequency, and cleaning;Milk Storage Initiated by:: JW Date initiated:: 12/01/16   Consult Status Consult Status: Follow-up Date: 12/02/16 Follow-up type: In-patient    [name] 12/01/2016, 5:41 PM

## 2016-12-01 NOTE — Transfer of Care (Signed)
Immediate Anesthesia Transfer of Care Note  Patient: Loretta Brown  Procedure(s) Performed: Procedure(s): CESAREAN SECTION (N/A)  Patient Location: PACU  Anesthesia Type:Spinal  Level of Consciousness: awake, alert , oriented and patient cooperative  Airway & Oxygen Therapy: Patient Spontanous Breathing  Post-op Assessment: Report given to RN and Post -op Vital signs reviewed and stable  Post vital signs: Reviewed and stable  Last Vitals:  Vitals:   12/01/16 0527 12/01/16 1006  BP: (!) 148/95 (!) 144/102  Pulse: 72 90  Resp: 19 17  Temp: 36.8 C     Last Pain:  Vitals:   12/01/16 1006  TempSrc:   PainSc: 3          Complications: No apparent anesthesia complications

## 2016-12-01 NOTE — Anesthesia Postprocedure Evaluation (Signed)
Anesthesia Post Note  Patient: Loretta Brown  Procedure(s) Performed: Procedure(s) (LRB): CESAREAN SECTION (N/A)     Patient location during evaluation: PACU Anesthesia Type: Spinal Level of consciousness: awake Pain management: pain level controlled Vital Signs Assessment: post-procedure vital signs reviewed and stable Respiratory status: spontaneous breathing Cardiovascular status: stable Postop Assessment: no headache, no backache, spinal receding, patient able to bend at knees and no signs of nausea or vomiting Anesthetic complications: no    Last Vitals:  Vitals:   12/01/16 1430 12/01/16 1446  BP: (!) 144/95 (!) 136/93  Pulse: 81 (!) 111  Resp: (!) 25 20  Temp:  36.5 C  SpO2: 94%     Last Pain:  Vitals:   12/01/16 1446  TempSrc:   PainSc: 4    Pain Goal:                 Irl Bodie JR,JOHN Prisha Hiley

## 2016-12-01 NOTE — Lactation Note (Signed)
This note was copied from a baby's chart. Lactation Consultation Note  Patient Name: Loretta Brown [id number] Date: 12/01/2016 Reason for consult: Follow-up assessment  NICU baby 7 hours old. Mom just back from NICU. Assisted mom to start use of DEBP. Reviewed assembly, disassembly and cleaning. Reviewed collection, labeling and EBM storage. Enc mom to take EBM to NICU. Discussed looking at photos on phone of baby while pumping. Discussed progression of milk coming to volume and the benefits of EBM. Enc mom to pump every 2-3 hours for a total of 8-12 times/24 hours followed by hand expression. Mom given NICU booklet with review. Enc mom to call for assistance as needed. Mom has personal DEBP and aware of pumping rooms in NICU.   Maternal Data Does the patient have breastfeeding experience prior to this delivery?: Yes  Feeding    LATCH Score                   Interventions    Lactation Tools Discussed/Used Tools: Pump Breast pump type: Double-Electric Breast Pump Pump Review: Setup, frequency, and cleaning;Milk Storage Initiated by:: JW Date initiated:: 12/01/16   Consult Status Consult Status: Follow-up Date: 12/02/16 Follow-up type: In-patient    [name] 12/01/2016, 7:00 PM

## 2016-12-01 NOTE — Op Note (Signed)
PreOp Diagnosis:  -Fetal bradycardia -Intrauterine pregnancy @ [redacted]w[redacted]d -Chronic HTN -GDMA1 -Morbid obestiy -Anxiety Depression  PostOp Diagnosis: same and uterine rupture  Procedure: Primary C-section Surgeon: Dr. Janyth Pupa Assistant, Anson Crofts, RNFA Anesthesia: spinal Complications: none  EBL: 1140cc UOP: 50cc Fluids: 2800  Findings: Female infant from vertex presentation, lower uterine rupture noted   PROCEDURE:  Informed consent was obtained from the patient with risks, benefits, complications, treatment options, and expected outcomes discussed with the patient.  The patient concurred with the proposed plan, giving informed consent with form signed.   The patient was taken to Operating Room, and identified with the procedure verified as C-Section Delivery with Time Out. With induction of anesthesia, the patient was prepped and draped in the usual sterile fashion. A Pfannenstiel incision was made and carried down through the subcutaneous tissue to the fascia. The fascia was incised in the midline and extended transversely. The superior aspect of the fascial incision was grasped with Kochers elevated and the underlying muscle dissected off. The inferior aspect of the facial incision was in similar fashion, grasped elevated and rectus muscles dissected off. The peritoneum was identified and entered. Peritoneal incision was extended longitudinally. The utero-vesical peritoneal reflection was identified and incised transversely with the [facility] scissors, the incision extended laterally, the bladder flap created digitally. A low transverse uterine incision was made and the infants head delivered atraumatically. After the umbilical cord was clamped and cut cord blood was obtained for evaluation.   The placenta was removed intact and appeared normal- concern for abruption noted. The uterine incision was closed with running locked sutures of 0 Vicryl and a second layer of the same stitch was  used in an imbricating fashion.  Bleeding was still noted and upon further examination- a large transverse uterine opening was appreciated- finding suggestive of a uterine rupture.  In running locked fashion,this was repaired using 0-vicryl.  Figure of eight stitches were used to obtain hemostasis.  The pericolic gutters were then cleared of all clots and debris. Excellent hemostasis was noted.  Surgicel was placed.  The alexis retractor was removed. The fascia was then reapproximated with running sutures of 0 Vicryl. The subcutaneous tissue was reapproximated with 2-0 plain gut suture.  The skin was closed with 4-0 vicryl in a subcuticular fashion.  Instrument, sponge, and needle counts were correct prior the abdominal closure and at the conclusion of the case. The patient was taken to recovery in stable condition.  Janyth Pupa, DO [phone] (pager) [phone] (office)

## 2016-12-01 NOTE — Progress Notes (Signed)
Late entry for 1130: At bedside due to fetal bradycardia- with fetal electrode, FHT was noted to be 60bpm for greater than 31min.  In light of these findings at -2 station and significant bleeding, plan was to proceed with emergent C-section and concern for abruption.  Verbal consent was obtained- Risk benefits and alternatives of cesarean section were discussed with the patient including but not limited to infection, bleeding, damage to bowel , bladder and baby with the need for further surgery. Pt voiced understanding and desires to proceed.   Janyth Pupa, DO [phone] (pager) [phone] (office)

## 2016-12-01 NOTE — Plan of Care (Signed)
Problem: Education: Goal: Knowledge of condition will improve Admission education, safety and unit protocols reviewed with patient and family.  Problem: Nutritional: Goal: Mothers verbalization of comfort with breastfeeding process will improve Baby sent to NICU. DEBP set up and explained use to patient.

## 2016-12-01 NOTE — Lactation Note (Signed)
This note was copied from a baby's chart. Lactation Consultation Note  Patient Name: Loretta Brown [id number] Date: 12/01/2016 Reason for consult: Initial assessment;NICU baby  NICU baby 81 hours old. Mom reports that she nursed first child for 7 weeks, but stopped d/t low milk supply. Mom has DEBP set up in room, but reports that she has not used pump yet. Offered to assist mom with pumping, but mom declined at this time and asked that this Lofall return later. Reviewed EBM storage guidelines, and pumping schedule.  Maternal Data Does the patient have breastfeeding experience prior to this delivery?: Yes  Feeding    LATCH Score                   Interventions    Lactation Tools Discussed/Used Tools: Pump Breast pump type: Double-Electric Breast Pump Pump Review: Setup, frequency, and cleaning;Milk Storage Initiated by:: JW Date initiated:: 12/01/16   Consult Status Consult Status: Follow-up Date: 12/02/16 Follow-up type: In-patient    [name] 12/01/2016, 4:32 PM

## 2016-12-01 NOTE — Anesthesia Procedure Notes (Signed)
Spinal  Patient location during procedure: OR Start time: 12/01/2016 11:38 AM End time: 12/01/2016 11:40 AM Staffing Anesthesiologist: Lyn Hollingshead Performed: anesthesiologist  Preanesthetic Checklist Completed: patient identified, surgical consent, pre-op evaluation, timeout performed, IV checked, risks and benefits discussed and monitors and equipment checked Spinal Block Patient position: sitting Prep: site prepped and draped and DuraPrep Patient monitoring: heart rate, cardiac monitor, continuous pulse ox and blood pressure Approach: midline Location: L3-4 Needle Needle type: Pencan  Needle length: 10 cm Needle insertion depth: 6 cm Assessment Sensory level: T6

## 2016-12-01 NOTE — Anesthesia Pain Management Evaluation Note (Signed)
  CRNA Pain Management Visit Note  Patient: Loretta Brown, 36 y.o., female  "Hello I am a member of the anesthesia team at [facility]. We have an anesthesia team available at all times to provide care throughout the hospital, including epidural management and anesthesia for C-section. I don't know your plan for the delivery whether it a natural birth, water birth, IV sedation, nitrous supplementation, doula or epidural, but we want to meet your pain goals."   1.Was your pain managed to your expectations on prior hospitalizations?   No   2.What is your expectation for pain management during this hospitalization?     Epidural and Nitrous Oxide  3.How can we help you reach that goal? Received IV meds with last baby and wasn't satisfied with result;discussed nitrous and epidural options. Will remain available as pt progresses through labor  Record the patient's initial score and the patient's pain goal.   Pain: 4  Pain Goal: 7 The [facility] wants you to be able to say your pain was always managed very well.  Everette Rank 12/01/2016

## 2016-12-02 LAB — GLUCOSE, CAPILLARY
GLUCOSE-CAPILLARY: 141 mg/dL — AB (ref 65–99)
GLUCOSE-CAPILLARY: 154 mg/dL — AB (ref 65–99)
Glucose-Capillary: 141 mg/dL — ABNORMAL HIGH (ref 65–99)
Glucose-Capillary: 111 mg/dL — ABNORMAL HIGH (ref 65–99)

## 2016-12-02 LAB — CBC
HCT: 28.4 % — ABNORMAL LOW (ref 36.0–46.0)
HEMOGLOBIN: 9.7 g/dL — AB (ref 12.0–15.0)
MCH: 30.2 pg (ref 26.0–34.0)
MCHC: 34.2 g/dL (ref 30.0–36.0)
MCV: 88.5 fL (ref 78.0–100.0)
PLATELETS: 328 10*3/uL (ref 150–400)
RBC: 3.21 MIL/uL — AB (ref 3.87–5.11)
RDW: 15.1 % (ref 11.5–15.5)
WBC: 19 10*3/uL — AB (ref 4.0–10.5)

## 2016-12-02 MED ORDER — FERROUS GLUCONATE 324 (38 FE) MG PO TABS
324.0000 mg | ORAL_TABLET | Freq: Every day | ORAL | Status: DC
  Administered 2016-12-02 – 2016-12-06 (×5): 324 mg via ORAL
  Filled 2016-12-02 (×7): qty 1

## 2016-12-02 MED ORDER — LABETALOL HCL 100 MG PO TABS
100.0000 mg | ORAL_TABLET | Freq: Two times a day (BID) | ORAL | Status: DC
  Administered 2016-12-02 – 2016-12-04 (×5): 100 mg via ORAL
  Filled 2016-12-02 (×5): qty 1

## 2016-12-02 NOTE — Plan of Care (Signed)
Problem: Activity: Goal: Risk for activity intolerance will decrease Outcome: Completed/Met Date Met: 12/02/16 Significant other wheeling patient up to NICU frequently. Encouraged mother to gradually begin to walk in hallway increasing her distance each time she goes up.

## 2016-12-02 NOTE — Anesthesia Postprocedure Evaluation (Signed)
Anesthesia Post Note  Patient: Loretta Brown  Procedure(s) Performed: Procedure(s) (LRB): CESAREAN SECTION (N/A)     Patient location during evaluation: Mother Baby Anesthesia Type: Spinal Level of consciousness: awake and awake and alert Pain management: pain level controlled Vital Signs Assessment: post-procedure vital signs reviewed and stable Respiratory status: spontaneous breathing Cardiovascular status: stable Postop Assessment: no headache, spinal receding, patient able to bend at knees and no signs of nausea or vomiting Anesthetic complications: no Comments: Pain score 2.    Last Vitals:  Vitals:   12/02/16 0245 12/02/16 0627  BP: (!) 132/97 140/88  Pulse: 61 75  Resp: 18 18  Temp: 37 C 36.6 C  SpO2:      Last Pain:  Vitals:   12/02/16 0627  TempSrc: Oral  PainSc: 1    Pain Goal: Patients Stated Pain Goal: 2 (12/01/16 1738)               Rico Sheehan

## 2016-12-02 NOTE — Addendum Note (Signed)
Addendum  created 12/02/16 0846 by Garner Nash, CRNA   Sign clinical note

## 2016-12-02 NOTE — Lactation Note (Signed)
Lactation Consultation Note  Patient Name: Loretta Brown [id number] Date: 12/02/2016 Reason for consult: Follow-up assessment;NICU baby;Other (Comment) (per mom has pumped x 3 since the DEBp was set up yesterday , see [place] note ) [place] encouraged mom to be consistent with the pumping at least 8 x's a day . Baby is 23 hours old, and per parents has been taken off oxygen and will be allowed to breast feed this afternoon.  Mom was not sure when. Also mom needs to void before she can go back to se the baby, and eat lunch.  LC recommended when she goes to see the baby to feed to have RN to call [place] to assess breast feeding.  LC did mention to mom the RN can assist also.     Maternal Data Does the patient have breastfeeding experience prior to this delivery?: No  Feeding    LATCH Score                   Interventions Interventions: Breast feeding basics reviewed  Lactation Tools Discussed/Used Pump Review:  (was set up yesterday by [facility] )   Consult Status Consult Status: Follow-up Date: 12/02/16 Follow-up type: In-patient    Pole Ojea 12/02/2016, 12:42 PM

## 2016-12-02 NOTE — Progress Notes (Signed)
Postoperative Note Day # 1  S:  Patient resting comfortable in bed and feeling much better today.  Pain well controlled. Tolerating gen diet. No flatus, no BM.  Lochia minimal. +ambulation.  Foley removed this am. No headache, no blurry vision, no RUQ pain.  O: Temp:  [97.7 F (36.5 C)-98.8 F (37.1 C)] 97.8 F (36.6 C) (09/07 0627) Pulse Rate:  [61-111] 75 (09/07 0627) Resp:  [15-25] 18 (09/07 0627) BP: (125-152)/(76-102) 140/88 (09/07 0627) SpO2:  [92 %-100 %] 92 % (09/06 2241)   Gen: A&Ox3, NAD CV: RRR Resp: CTAB Abdomen: soft, ND, non-tender, +BS Uterus: firm, non-tender, below umbilicus Incision: c/d/i, bandage on Ext: No edema, no calf tenderness bilaterally, SCDs in place  CBC Latest Ref Rng & Units 12/02/2016 12/01/2016 12/01/2016  WBC 4.0 - 10.5 K/uL 19.0(H) 13.1(H) 9.8  Hemoglobin 12.0 - 15.0 g/dL 9.7(L) 12.0 11.8(L)  Hematocrit 36.0 - 46.0 % 28.4(L) 35.7(L) 34.3(L)  Platelets 150 - 400 K/uL 328 374 396     A/P: Pt is a 36 y.o. [id number] s/p primary C-section due to fetal bradycardia with uterine rupture, POD#1  - pain well controlled -GU: pt to void on her own today -GN:FAOZHYQMVH gen diet -Activity: encouraged sitting up to chair and ambulation as tolerated -Prophylaxis: SCDs while in bed -Labs: appropriate decline for EBL, plan to start iron daily -chronic HTN- labile BPs, but overall stable.  Will increase Labetalol to twice daily.  Pt asymptomatic, no evidence of preeclampsia -GDMA1- fasting accucheck in am and before meals to be done today -Dep/anxiety: continue Lexapro 20mg  daily -Baby in NICU doing well, declined circ  DISPO: Continue with routine postoperative care  Janyth Pupa, DO [phone] (pager) [phone] (office)

## 2016-12-03 LAB — GLUCOSE, CAPILLARY
GLUCOSE-CAPILLARY: 133 mg/dL — AB (ref 65–99)
GLUCOSE-CAPILLARY: 101 mg/dL — AB (ref 65–99)

## 2016-12-03 MED ORDER — IBUPROFEN 600 MG PO TABS: 600.0000 mg | ORAL_TABLET | Freq: Four times a day (QID) | ORAL | 1 refills | Status: DC

## 2016-12-03 MED ORDER — OXYCODONE HCL 5 MG PO TABS
5.0000 mg | ORAL_TABLET | ORAL | 0 refills | Status: DC | PRN
Start: 2016-12-03 — End: 2017-07-23

## 2016-12-03 NOTE — Progress Notes (Signed)
Oxycodone paper prescription for discharge on shadow chart. Loretta Brown

## 2016-12-03 NOTE — Discharge Instructions (Signed)
Preeclampsia and Eclampsia °Preeclampsia is a serious condition that develops only during pregnancy. It is also called toxemia of pregnancy. This condition causes high blood pressure along with other symptoms, such as swelling and headaches. These symptoms may develop as the condition gets worse. Preeclampsia may occur at 20 weeks of pregnancy or later. °Diagnosing and treating preeclampsia early is very important. If not treated early, it can cause serious problems for you and your baby. One problem it can lead to is eclampsia, which is a condition that causes muscle jerking or shaking (convulsions or seizures) in the mother. Delivering your baby is the best treatment for preeclampsia or eclampsia. Preeclampsia and eclampsia symptoms usually go away after your baby is born. °What are the causes? °The cause of preeclampsia is not known. °What increases the risk? °The following risk factors make you more likely to develop preeclampsia: °· Being pregnant for the first time. °· Having had preeclampsia during a past pregnancy. °· Having a family history of preeclampsia. °· Having high blood pressure. °· Being pregnant with twins or triplets. °· Being 35 or older. °· Being African-American. °· Having kidney disease or diabetes. °· Having medical conditions such as lupus or blood diseases. °· Being very overweight (obese). ° °What are the signs or symptoms? °The earliest signs of preeclampsia are: °· High blood pressure. °· Increased protein in your urine. Your health care provider will check for this at every visit before you give birth (prenatal visit). ° °Other symptoms that may develop as the condition gets worse include: °· Severe headaches. °· Sudden weight gain. °· Swelling of the hands, face, legs, and feet. °· Nausea and vomiting. °· Vision problems, such as blurred or double vision. °· Numbness in the face, arms, legs, and feet. °· Urinating less than usual. °· Dizziness. °· Slurred speech. °· Abdominal pain,  especially upper abdominal pain. °· Convulsions or seizures. ° °Symptoms generally go away after giving birth. °How is this diagnosed? °There are no screening tests for preeclampsia. Your health care provider will ask you about symptoms and check for signs of preeclampsia during your prenatal visits. You may also have tests that include: °· Urine tests. °· Blood tests. °· Checking your blood pressure. °· Monitoring your baby’s heart rate. °· Ultrasound. ° °How is this treated? °You and your health care provider will determine the treatment approach that is best for you. Treatment may include: °· Having more frequent prenatal exams to check for signs of preeclampsia, if you have an increased risk for preeclampsia. °· Bed rest. °· Reducing how much salt (sodium) you eat. °· Medicine to lower your blood pressure. °· Staying in the hospital, if your condition is severe. There, treatment will focus on controlling your blood pressure and the amount of fluids in your body (fluid retention). °· You may need to take medicine (magnesium sulfate) to prevent seizures. This medicine may be given as an injection or through an IV tube. °· Delivering your baby early, if your condition gets worse. You may have your labor started with medicine (induced), or you may have a cesarean delivery. ° °Follow these instructions at home: °Eating and drinking ° °· Drink enough fluid to keep your urine clear or pale yellow. °· Eat a healthy diet that is low in sodium. Do not add salt to your food. Check nutrition labels to see how much sodium a food or beverage contains. °· Avoid caffeine. °Lifestyle °· Do not use any products that contain nicotine or tobacco, such as cigarettes   and e-cigarettes. If you need help quitting, ask your health care provider.  Do not use alcohol or drugs.  Avoid stress as much as possible. Rest and get plenty of sleep. General instructions  Take over-the-counter and prescription medicines only as told by your  health care provider.  When lying down, lie on your side. This keeps pressure off of your baby.  When sitting or lying down, raise (elevate) your feet. Try putting some pillows underneath your lower legs.  Exercise regularly. Ask your health care provider what kinds of exercise are best for you.  Keep all follow-up and prenatal visits as told by your health care provider. This is important. How is this prevented? To prevent preeclampsia or eclampsia from developing during another pregnancy:  Get proper medical care during pregnancy. Your health care provider may be able to prevent preeclampsia or diagnose and treat it early.  Your health care provider may have you take a low-dose aspirin or a calcium supplement during your next pregnancy.  You may have tests of your blood pressure and kidney function after giving birth.  Maintain a healthy weight. Ask your health care provider for help managing weight gain during pregnancy.  Work with your health care provider to manage any long-term (chronic) health conditions you have, such as diabetes or kidney problems.  Contact a health care provider if:  You gain more weight than expected.  You have headaches.  You have nausea or vomiting.  You have abdominal pain.  You feel dizzy or light-headed. Get help right away if:  You develop sudden or severe swelling anywhere in your body. This usually happens in the legs.  You gain 5 lbs (2.3 kg) or more during one week.  You have severe: ? Abdominal pain. ? Headaches. ? Dizziness. ? Vision problems. ? Confusion. ? Nausea or vomiting.  You have a seizure.  You have trouble moving any part of your body.  You develop numbness in any part of your body.  You have trouble speaking.  You have any abnormal bleeding.  You pass out. This information is not intended to replace advice given to you by your health care provider. Make sure you discuss any questions you have with your health  care provider. Document Released: 03/11/2000 Document Revised: 11/10/2015 Document Reviewed: 10/19/2015 Elsevier Interactive Patient Education  2018 [employer].   Cesarean Delivery, Care After Refer to this sheet in the next few weeks. These instructions provide you with information about caring for yourself after your procedure. Your health care provider may also give you more specific instructions. Your treatment has been planned according to current medical practices, but problems sometimes occur. Call your health care provider if you have any problems or questions after your procedure. What can I expect after the procedure? After the procedure, it is common to have:  A small amount of blood or clear fluid coming from the incision.  Some redness, swelling, and pain in your incision area.  Some abdominal pain and soreness.  Vaginal bleeding (lochia).  Pelvic cramps.  Fatigue.  Follow these instructions at home: Incision care   Follow instructions from your health care provider about how to take care of your incision. Make sure you: ? Wash your hands with soap and water before you change your bandage (dressing). If soap and water are not available, use hand sanitizer. ? Change your dressing as told by your health care provider. ? Leave stitches (sutures), skin staples, skin glue, or adhesive strips in place. These  skin closures may need to stay in place for 2 weeks or longer. If adhesive strip edges start to loosen and curl up, you may trim the loose edges. Do not remove adhesive strips completely unless your health care provider tells you to do that.  Check your incision area every day for signs of infection. Check for: ? More redness, swelling, or pain. ? More fluid or blood. ? Warmth. ? Pus or a bad smell.  When you cough or sneeze, hug a pillow. This helps with pain and decreases the chance of your incision opening up (dehiscing). Do this until your incision  heals. Medicines  Take over-the-counter and prescription medicines only as told by your health care provider.  If you were prescribed an antibiotic medicine, take it as told by your health care provider. Do not stop taking the antibiotic until it is finished. Driving  Do not drive or operate heavy machinery while taking prescription pain medicine.  Do not drive for 24 hours if you received a sedative. Lifestyle  Do not drink alcohol. This is especially important if you are breastfeeding or taking pain medicine.  Do not use tobacco products, including cigarettes, chewing tobacco, or e-cigarettes. If you need help quitting, ask your health care provider. Tobacco can delay wound healing. Eating and drinking  Drink at least 8 eight-ounce glasses of water every day unless told not to by your health care provider. If you breastfeed, you may need to drink more water than this.  Eat high-fiber foods every day. These foods may help prevent or relieve constipation. High-fiber foods include: ? Whole grain cereals and breads. ? Brown rice. ? Beans. ? Fresh fruits and vegetables. Activity  Return to your normal activities as told by your health care provider. Ask your health care provider what activities are safe for you.  Rest as much as possible. Try to rest or take a nap while your baby is sleeping.  Do not lift anything that is heavier than your baby or 10 lb (4.5 kg) as told by your health care provider.  Ask your health care provider when you can engage in sexual activity. This may depend on your: ? Risk of infection. ? Healing rate. ? Comfort and desire to engage in sexual activity. Bathing  Do not take baths, swim, or use a hot tub until your health care provider approves. Ask your health care provider if you can take showers. You may only be allowed to take sponge baths until your incision heals.  Keep your dressing dry as told by your health care provider. General  instructions  Do not use tampons or douches until your health care provider approves.  Wear: ? Loose, comfortable clothing. ? A supportive and well-fitting bra.  Watch for any blood clots that may pass from your vagina. These may look like clumps of dark red, brown, or black discharge.  Keep your perineum clean and dry as told by your health care provider.  Wipe from front to back when you use the toilet.  If possible, have someone help you care for your baby and help with household activities for a few days after you leave the hospital.  Keep all follow-up visits for you and your baby as told by your health care provider. This is important. Contact a health care provider if:  You have: ? Bad-smelling vaginal discharge. ? Difficulty urinating. ? Pain when urinating. ? A sudden increase or decrease in the frequency of your bowel movements. ? More redness, swelling,  or pain around your incision. ? More fluid or blood coming from your incision. ? Pus or a bad smell coming from your incision. ? A fever. ? A rash. ? Little or no interest in activities you used to enjoy. ? Questions about caring for yourself or your baby. ? Nausea.  Your incision feels warm to the touch.  Your breasts turn red or become painful or hard.  You feel unusually sad or worried.  You vomit.  You pass large blood clots from your vagina. If you pass a blood clot, save it to show to your health care provider. Do not flush blood clots down the toilet without showing your health care provider.  You urinate more than usual.  You are dizzy or light-headed.  You have not breastfed and have not had a menstrual period for 12 weeks after delivery.  You stopped breastfeeding and have not had a menstrual period for 12 weeks after stopping breastfeeding. Get help right away if:  You have: ? Pain that does not go away or get better with medicine. ? Chest pain. ? Difficulty breathing. ? Blurred vision or  spots in your vision. ? Thoughts about hurting yourself or your baby. ? New pain in your abdomen or in one of your legs. ? A severe headache.  You faint.  You bleed from your vagina so much that you fill two sanitary pads in one hour. This information is not intended to replace advice given to you by your health care provider. Make sure you discuss any questions you have with your health care provider. Document Released: 12/04/2001 Document Revised: 07/23/2015 Document Reviewed: 02/16/2015 Elsevier Interactive Patient Education  2017 [employer].

## 2016-12-03 NOTE — Progress Notes (Signed)
CSW attempted to meet with MOB to complete assessment for NICU admission and for her hx of depression and edinburgh scale score. Upon this writers arrival, MOB's RN noted there was a mix up and patient is not discharging home today. MOB noted to follow-up tomorrow. CSW will visit with MOB at a later time.   Supriya Beaston, MSW, LCSW-A Clinical Social Worker  [facility]  Office: [phone]

## 2016-12-03 NOTE — Lactation Note (Signed)
This note was copied from a baby's chart. Lactation Consultation Note  Patient Name: Loretta Brown [id number]'J Date: 12/03/2016 Reason for consult: Follow-up assessment  NICU baby 70 hours old. Mom has EBM at bedside and states that she is continuing to pump every 2-3 hours and taking EBM to baby in NICU. Mom reports that she has a personal DEBP at home. Enc to take kit with her at D/C, and knows about pumping rooms in NICU.  Maternal Data    Feeding Feeding Type: Donor Breast Milk Nipple Type: Slow - flow Length of feed: 10 min  LATCH Score                   Interventions    Lactation Tools Discussed/Used     Consult Status Consult Status: Follow-up Date: 12/04/16 Follow-up type: In-patient    [name] 12/03/2016, 12:30 PM

## 2016-12-03 NOTE — Progress Notes (Signed)
Subjective: Postop Day 2: Cesarean Delivery No complaints.  Pain controlled.  Lochia normal.  Breast feeding pumping only.  Baby in NICU.  Objective: Temp:  [98.6 F (37 C)-99.4 F (37.4 C)] 98.7 F (37.1 C) (09/08 0554) Pulse Rate:  [83-97] 83 (09/08 0554) Resp:  [18-20] 18 (09/08 0554) BP: (128-153)/(81-99) 137/88 (09/08 0554) SpO2:  [98 %] 98 % (09/07 1224)  Physical Exam: Gen: NAD Lochia: Not visualized Uterine Fundus: firm, appropriately tender Incision:Dressing is soiled DVT Evaluation: + Edema present, no calf tenderness bilaterally    Recent Labs  12/01/16 1028 12/02/16 0558  HGB 12.0 9.7*  HCT 35.7* 28.4*       Assessment/Plan: Status post C-section-doing well postoperatively. CHTN restarted on Labetalol 100 mg BID.  BP well controlled.  Continue to observe. BP q 4 hours. GDM -CBG in range.   Encouraged ambulation in hall. Change dressing. Discharge tomorrow if BP normal. Lactation support.    Loretta Brown 12/03/2016, 11:59 AM

## 2016-12-04 LAB — GLUCOSE, CAPILLARY
GLUCOSE-CAPILLARY: 121 mg/dL — AB (ref 65–99)
GLUCOSE-CAPILLARY: 112 mg/dL — AB (ref 65–99)
Glucose-Capillary: 107 mg/dL — ABNORMAL HIGH (ref 65–99)
Glucose-Capillary: 119 mg/dL — ABNORMAL HIGH (ref 65–99)
Glucose-Capillary: 98 mg/dL (ref 65–99)

## 2016-12-04 LAB — COMPREHENSIVE METABOLIC PANEL
ALK PHOS: 87 U/L (ref 38–126)
ALT: 16 U/L (ref 14–54)
ANION GAP: 11 (ref 5–15)
AST: 31 U/L (ref 15–41)
Albumin: 2.5 g/dL — ABNORMAL LOW (ref 3.5–5.0)
BUN: 20 mg/dL (ref 6–20)
CALCIUM: 8.5 mg/dL — AB (ref 8.9–10.3)
CO2: 24 mmol/L (ref 22–32)
Chloride: 105 mmol/L (ref 101–111)
Creatinine, Ser: 0.81 mg/dL (ref 0.44–1.00)
Glucose, Bld: 124 mg/dL — ABNORMAL HIGH (ref 65–99)
POTASSIUM: 3.6 mmol/L (ref 3.5–5.1)
Sodium: 140 mmol/L (ref 135–145)
TOTAL PROTEIN: 5.8 g/dL — AB (ref 6.5–8.1)
Total Bilirubin: 0.6 mg/dL (ref 0.3–1.2)

## 2016-12-04 LAB — CBC
HCT: 25.8 % — ABNORMAL LOW (ref 36.0–46.0)
HEMOGLOBIN: 8.5 g/dL — AB (ref 12.0–15.0)
MCH: 30 pg (ref 26.0–34.0)
MCHC: 32.9 g/dL (ref 30.0–36.0)
MCV: 91.2 fL (ref 78.0–100.0)
Platelets: 398 10*3/uL (ref 150–400)
RBC: 2.83 MIL/uL — ABNORMAL LOW (ref 3.87–5.11)
RDW: 15.3 % (ref 11.5–15.5)
WBC: 17.8 10*3/uL — ABNORMAL HIGH (ref 4.0–10.5)

## 2016-12-04 MED ORDER — NIFEDIPINE ER OSMOTIC RELEASE 30 MG PO TB24
30.0000 mg | ORAL_TABLET | Freq: Once | ORAL | Status: AC
  Administered 2016-12-05: 30 mg via ORAL
  Filled 2016-12-04: qty 1

## 2016-12-04 MED ORDER — LABETALOL HCL 200 MG PO TABS
200.0000 mg | ORAL_TABLET | Freq: Three times a day (TID) | ORAL | Status: DC
  Administered 2016-12-04: 200 mg via ORAL
  Filled 2016-12-04: qty 1

## 2016-12-04 MED ORDER — LABETALOL HCL 100 MG PO TABS: 100.0000 mg | ORAL_TABLET | Freq: Once | ORAL | Status: DC

## 2016-12-04 NOTE — Clinical Social Work Maternal (Addendum)
  CLINICAL SOCIAL WORK MATERNAL/CHILD NOTE  Patient Details  Name: Loretta Brown MRN: [id number] Date of Birth: 1980-08-15  Date:  12/04/2016  Clinical Social Worker Initiating Note:  Ferdinand Lango [place], MSW, LCSW-A  Date/ Time Initiated:  12/04/16/1400     Child's Name:  Oda Cogan, MSW, LCSW-A   Legal Guardian:  Other (Comment) (Not estbalished by court system; MOB and FOB parent together in primary household composition )   Need for Interpreter:  None   Date of Referral:  12/01/16     Reason for Referral:  Other (Comment) (NICU admit and Edinburge scale score )   Referral Source:  RN   Address:  [place] [address]  Phone number:  [phone]   Household Members:  Self, Minor Children, Spouse   Natural Supports (not living in the home):  Friends, Extended Family   Professional Supports: None   Employment: Full-time   Type of Work: 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   Cultural/Religious Considerations Which May Impact Care:  non-denominational   Strengths:  Ability to meet basic needs , Pediatrician chosen , Compliance with medical plan , Home prepared for child  ([place] )   Risk Factors/Current Problems:  Mental Health Concerns    Cognitive State:  Able to Concentrate , Alert , Goal Oriented , Insightful    Mood/Affect:  Calm , Comfortable , Interested    CSW Assessment: CSW met with MOB at bedside to complete assessment for new NICU admit and hx of depression with edinburge scale score of 11. Upon this writers arrival, MOB was warm and welcoming . This Probation officer explained role and reasoning for visit. This Chief Strategy Officer MOB on the arrival of baby. MOB was thankful and noted he is doing exceptionally well given the circumstances. This Probation officer offered support and resources due to NICU admission. MOB was thankful but noted she has no needs at  this time. CSW inquired about depression hx. MOB notes she has suffered from depression since she was a teenager but is currently taking lexapro and feel's it is helping her tremendously. CSW provided education regarding Baby Blues vs PMADs and provided MOB with information about support groups held at [place] encouraged MOB to evaluate her mental health throughout the postpartum period with the use of the New Mom Checklist developed by [facility] and notify a medical professional if symptoms arise. 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   Upon this Probation officer completing a chart review of MOB's chart, this Probation officer saw a hx of THC use noted but unsure of when. This Probation officer asked MOB about hx. MOB notes she has not used substance during pregnancy but has in the past. This Probation officer informed MOB that a CDS was collected on baby due to abruption during delivery; however, if there is illegal substance found it will warrant a DSS report. MOB verbalized understanding. This Probation officer thanked MOB for her time and noted she is available should she have any other needs during this NICU admission. MOB was thankful.    CSW Plan/Description:  Information/Referral to [employer] , Patient/Family Education     [employer], MSW, [facility]  Office: [phone]

## 2016-12-04 NOTE — Progress Notes (Addendum)
Subjective: Postop Day 3: Cesarean Delivery No complaints.  Pain controlled.  Lochia normal.  Breast feeding yes-pumping only. Addendum:  Pt denies headache or visual changes.  Objective: Temp:  [98 F (36.7 C)-98.9 F (37.2 C)] 98 F (36.7 C) (09/09 0610) Pulse Rate:  [85-95] 88 (09/09 1531) Resp:  [18-20] 18 (09/09 0610) BP: (145-159)/(85-100) 152/90 (09/09 1531) SpO2:  [98 %] 98 % (09/09 0610)  Physical Exam: Gen: NAD Lochia: Not visualized Uterine Fundus: firm, appropriately tender Abdomen:  Bruising in RLQ in striae, nontender   Incision: Dressing moist but not blood noted. DVT Evaluation: + Edema present, no calf tenderness bilaterally    Recent Labs  12/02/16 0558  HGB 9.7*  HCT 28.4*    Assessment/Plan: S/p primary c-section due to abruption with uterine rupture CHTN-BP not well controlled on Labetalol 100 mg BID.  Repeat preeclampsia labs.  Start Labetalol 200 mg TID.  May benefit from Procardia once daily but will continue Labetalol since she was well controlled on this prior to pregnancy. GDM-blood sugars normal. Discharge home when BP well controlled. Monitor BP closely, every 4 hours.     Thurnell Lose 12/04/2016, 4:27 PM

## 2016-12-04 NOTE — Progress Notes (Signed)
Pt instructed to notify nurse when meal completed in order to do blood sugar.  Pt v.u.

## 2016-12-05 LAB — GLUCOSE, CAPILLARY
GLUCOSE-CAPILLARY: 110 mg/dL — AB (ref 65–99)
GLUCOSE-CAPILLARY: 106 mg/dL — AB (ref 65–99)
GLUCOSE-CAPILLARY: 122 mg/dL — AB (ref 65–99)
Glucose-Capillary: 117 mg/dL — ABNORMAL HIGH (ref 65–99)

## 2016-12-05 LAB — PROTEIN / CREATININE RATIO, URINE
CREATININE, URINE: 20 mg/dL
Protein Creatinine Ratio: 0.65 mg/mg{Cre} — ABNORMAL HIGH (ref 0.00–0.15)
TOTAL PROTEIN, URINE: 13 mg/dL

## 2016-12-05 MED ORDER — BUTALBITAL-APAP-CAFFEINE 50-325-40 MG PO TABS
2.0000 | ORAL_TABLET | Freq: Four times a day (QID) | ORAL | Status: DC | PRN
  Administered 2016-12-06: 2 via ORAL
  Filled 2016-12-05: qty 2

## 2016-12-05 MED ORDER — LABETALOL HCL 200 MG PO TABS
300.0000 mg | ORAL_TABLET | Freq: Two times a day (BID) | ORAL | Status: DC
  Administered 2016-12-05 – 2016-12-06 (×3): 300 mg via ORAL
  Filled 2016-12-05 (×3): qty 1

## 2016-12-05 MED ORDER — MAGNESIUM SULFATE 40 G IN LACTATED RINGERS - SIMPLE
2.0000 g/h | INTRAVENOUS | Status: DC
  Administered 2016-12-05 – 2016-12-06 (×2): 2 g/h via INTRAVENOUS
  Filled 2016-12-05 (×2): qty 40

## 2016-12-05 MED ORDER — MAGNESIUM SULFATE BOLUS VIA INFUSION
4.0000 g | Freq: Once | INTRAVENOUS | Status: AC
  Administered 2016-12-05: 4 g via INTRAVENOUS
  Filled 2016-12-05: qty 500

## 2016-12-05 MED ORDER — LABETALOL HCL 5 MG/ML IV SOLN: 20.0000 mg | INTRAVENOUS | Status: DC | PRN

## 2016-12-05 MED ORDER — HYDRALAZINE HCL 20 MG/ML IJ SOLN: 10.0000 mg | Freq: Once | INTRAMUSCULAR | Status: DC | PRN

## 2016-12-05 NOTE — Progress Notes (Signed)
Dr. Marius Ditch called w/new orders.  Pt updated.  Sig other asleep at bedside.

## 2016-12-05 NOTE — Progress Notes (Signed)
Urine sent to lab 

## 2016-12-05 NOTE — Lactation Note (Signed)
This note was copied from a baby's chart. Lactation Consultation Note  Patient Name: Loretta Brown [id number] Date: 12/05/2016 Reason for consult: Follow-up assessment    With this mom and term baby, now 72 days old. The mom was started on Magnesium drip today, and [facility], and the baby was discharged from NICU back to mom. I assisted mom with latching the baby in football ohld. He fed well, with good breast movement, no discomfort from mom, for 12 minutes,  Mom was decreased to 21 flanges, and applied coconut oil to her nipples prior to pumping. Mom was expressing 2-3  ounces of milk, and was happy. The baby was showing hunger cues, so I advised mom to supplement the baby with her EBM, after pumping. Mom has extremely large, soft breasts, making pumping and BF difficult.  The baby has a somewhat restricted tongue from a short, thick posterior frenulum, but his tongue seemed functional with the latch and breastfeeding I observed. I told mom to notify lactation tomorrow, if his latch becomes uncomfortable. Mom advised to pump around the clock, every 3 hours, until she stops dripping, 15-30 minutes.   Maternal Data Formula Feeding for Exclusion: Yes (baby in NICU) Has patient been taught Hand Expression?: Yes  Feeding Feeding Type: Breast Fed Length of feed: 15 min  LATCH Score Latch: Grasps breast easily, tongue down, lips flanged, rhythmical sucking.  Audible Swallowing: A few with stimulation  Type of Nipple: Everted at rest and after stimulation  Comfort (Breast/Nipple): Filling, red/small blisters or bruises, mild/mod discomfort  Hold (Positioning): Assistance needed to correctly position infant at breast and maintain latch.  LATCH Score: 7  Interventions Interventions: Breast feeding basics reviewed;Assisted with latch;Breast massage;Hand express;Adjust position;Support pillows;Position options;Expressed milk;Coconut oil;DEBP  Lactation Tools  Discussed/Used Tools: Flanges Flange Size: 21 (good fit, also added coconut oil)   Consult Status Consult Status: Follow-up Date: 12/06/16 Follow-up type: In-patient    Tonna Corner 12/05/2016, 3:20 PM

## 2016-12-05 NOTE — Progress Notes (Signed)
Report called to Waneta Martins RN on 3rd floor

## 2016-12-05 NOTE — Progress Notes (Addendum)
Subjective: Postop Day 4: Cesarean Delivery No complaints.  Denies headache, visual changes, upper abdominal pain. Decreased O2 saturations to 80s when sleeping.  Started Nasal cannula O2.  RN reports pt has not done IS consistently.    Objective: Temp:  [98.8 F (37.1 C)-98.9 F (37.2 C)] 98.9 F (37.2 C) (09/10 0701) Pulse Rate:  [73-88] 80 (09/10 0720) Resp:  [18] 18 (09/10 0720) BP: (151-167)/(81-94) 156/89 (09/10 0720) SpO2:  [89 %-98 %] 89 % (09/10 0720)  Physical Exam: Gen: NAD Lochia: Not visualized Uterine Fundus: firm, appropriately tender Abdomen:  Bruising in RLQ in striae-stable, nontender   Incision: Dressing moist but not blood noted. DVT Evaluation: + Edema present, no calf tenderness bilaterally    Recent Labs  12/04/16 1657  HGB 8.5*  HCT 25.8*   CMP     Component Value Date/Time   NA 140 12/04/2016 1657   K 3.6 12/04/2016 1657   CL 105 12/04/2016 1657   CO2 24 12/04/2016 1657   GLUCOSE 124 (H) 12/04/2016 1657   BUN 20 12/04/2016 1657   CREATININE 0.81 12/04/2016 1657   CALCIUM 8.5 (L) 12/04/2016 1657   PROT 5.8 (L) 12/04/2016 1657   ALBUMIN 2.5 (L) 12/04/2016 1657   AST 31 12/04/2016 1657   ALT 16 12/04/2016 1657   ALKPHOS 87 12/04/2016 1657   BILITOT 0.6 12/04/2016 1657   GFRNONAA >60 12/04/2016 1657   GFRAA >60 12/04/2016 1657   CBC    Component Value Date/Time   WBC 17.8 (H) 12/04/2016 1657   RBC 2.83 (L) 12/04/2016 1657   HGB 8.5 (L) 12/04/2016 1657   HCT 25.8 (L) 12/04/2016 1657   PLT 398 12/04/2016 1657   MCV 91.2 12/04/2016 1657   MCH 30.0 12/04/2016 1657   MCHC 32.9 12/04/2016 1657   RDW 15.3 12/04/2016 1657   PCR 0.65  Assessment/Plan: S/p primary c-section due to abruption with uterine rupture CHTN now with superimposed preeclampsia.  IV Labetalol for severe range BPs  Magnesium sulfate x 24 hours for seizure prophylaxis.  Strict I/Os.  TED hose for DVT prophylaxis. GDM- CBGs discontinued. Decreased O2 saturations.   Continue O2.  Respiratory consult to review IS. Resume Procardia after Magnesium completed. Anticipate discharge in am.  Addendum- BP 160/79.  Restart Labetalol 300 mg BID while on Magnesium.     Thurnell Lose 12/05/2016, 7:46 AM

## 2016-12-06 LAB — GLUCOSE, CAPILLARY
GLUCOSE-CAPILLARY: 94 mg/dL (ref 65–99)
Glucose-Capillary: 98 mg/dL (ref 65–99)

## 2016-12-06 MED ORDER — LABETALOL HCL 300 MG PO TABS: 300.0000 mg | ORAL_TABLET | Freq: Two times a day (BID) | ORAL | 3 refills | Status: DC

## 2016-12-06 MED ORDER — NIFEDIPINE ER OSMOTIC RELEASE 30 MG PO TB24
30.0000 mg | ORAL_TABLET | Freq: Every day | ORAL | Status: DC
  Administered 2016-12-06: 30 mg via ORAL
  Filled 2016-12-06: qty 1

## 2016-12-06 MED ORDER — NIFEDIPINE ER 30 MG PO TB24: 30.0000 mg | ORAL_TABLET | Freq: Every day | ORAL | 3 refills | Status: DC

## 2016-12-06 MED ORDER — BUTALBITAL-APAP-CAFFEINE 50-325-40 MG PO TABS: 1.0000 | ORAL_TABLET | Freq: Four times a day (QID) | ORAL | 0 refills | Status: DC | PRN

## 2016-12-06 NOTE — Progress Notes (Signed)
Teaching complete  Husband with pt  Questions answered

## 2016-12-06 NOTE — Lactation Note (Signed)
This note was copied from a baby's chart. Lactation Consultation Note  Patient Name: Loretta Brown [id number] Date: 12/06/2016 Reason for consult: Follow-up assessment   Infant has had 14 feedings in past 24 hrs.  Mixed feedings of direct breastfeeding and EBM supplementation.  Mom has been pumping and is pumping 30-60 ml with each pumping; voids-9; stools-4 in past 24 hrs.  Mom is P2.   GA 37.1; BW 6 lbs, 13.4 oz; 4% weight loss.  Infant went to NICU after delivery d/t respiratory distress but is now back in mom's room and spent last night in mom's room.    Followed-up with mom at 1225 but mom in shower.  Spoke with dad and answered milk supply / pain with breastfeeding questions.  First child mom experienced pain with feedings and infant weight loss so they began supplementing.  FOB stated mom never had a full supply with first child.  LC to come back to visit with mom when she is out of shower.  LC came back to visit at 1:30.  Reviewed with mom supply and demand.  Encouraged mom to continue pumping after breastfeeding but to offer breast first prior to giving pumped milk.  Explained that if she began to experience pain with breastfeeding or has difficulty keeping infant at breast with feedings then to call lactation for an outpatient appointment.  Mom is currently pumping 30-60 ml with each pumping.  LC checked to make sure mom has all pump parts removed from DEBP for discharge to home.  Mom has Medela DEBP she plans to use at home.  Encouraged mom to call for questions or concerns after discharge.      Consult Status Consult Status: Complete    Merlene Laughter 12/06/2016, 1:45 PM

## 2016-12-06 NOTE — Progress Notes (Signed)
Subjective: Postop Day 5: Cesarean Delivery Reports headache last night, but has now resolved s/p Fioricet.  Denies visual changes, upper abdominal pain.  Pain well controlled.  Tolerating gen diet.  +flatus, +BM.  Overall feeling well.  Objective: Temp:  [98 F (36.7 C)-99 F (37.2 C)] 98.5 F (36.9 C) (09/11 0421) Pulse Rate:  [73-89] 73 (09/11 0421) Resp:  [17-20] 19 (09/11 0500) BP: (121-167)/(77-95) 136/92 (09/11 0421) SpO2:  [89 %-99 %] 92 % (09/11 0016)  Physical Exam: Gen: NAD CV: RRR Lungs: CTAB Abd: +BS, obese, soft, non-tender, no rebound, no guarding Lochia: Not visualized Uterine Fundus: firm, below umbilicus Incision: Dressing unchanged- clean and ry DVT Evaluation: no edema, no calf tenderness bilaterally  Assessment/Plan: S/p primary c-section due to abruption with uterine rupture CHTN now with superimposed preeclampsia.  POD#5  -s/p IV Magnesium x 23hr -plan to restart Procardia XL 30mg  daily and continue Labetalol 300mg  bid -DVT prophylaxis- ambulation as tolerated, SCDs while in bed GDM- CBGs discontinued, remained stable Decreased O2 saturations.  Continue O2 as needed.  []  plan for follow up with PCP for sleep study  Dispo: If blood pressures remain stable this am, plan to discharge home later today with close outpatient follow up  Loretta Pupa, DO [phone] (pager) [phone] (office)         Loretta Brown, [place] 12/06/2016, 6:38 AM

## 2016-12-12 NOTE — Discharge Summary (Signed)
OB Discharge Summary     Patient Name: Loretta Brown DOB: Sep 05, 1980 MRN: [id number]  Date of admission: 12/01/2016 Delivering MD: Janyth Pupa   Date of discharge: 12/06/2016  Admitting diagnosis: INDUCTION Intrauterine pregnancy: Unknown     Secondary diagnosis:  Active Problems:   Chronic hypertension affecting pregnancy  Additional problems: Superimposed preeclampsia, Uterine and placental abruption, Obesity, GDMA1, Anxiety/Depression     Discharge diagnosis: Term Pregnancy Delivered, Preeclampsia (severe), CHTN, GDM A1 and anxiety                                                                                                Post partum procedures:none  Augmentation: Pitocin and Cytotec  Complications: Placental Abruption  Hospital course:  Induction of Labor With Cesarean Section  36 y.o. yo [id number] at Unknown was admitted to the hospital 12/01/2016 for induction of labor. Patient had a labor course significant for non-reassuring heart tones remote from delivery and significant vaginal bleeding. The patient went for cesarean section due to Non-Reassuring FHR, and delivered a Viable infant,) Membrane Rupture Time/Date: )11:47 AM ,12/01/2016   Details of operation can be found in separate operative Note. Postpartum course was complicated by severe preeclampisa.  Pt received IV Magnesium and blood pressure was adjusted as need.  She is ambulating, tolerating a regular diet, passing flatus, and urinating well.  Patient is discharged home in stable condition on 12/06/16.                                    Physical exam  Vitals:   12/06/16 0500 12/06/16 0800 12/06/16 1030 12/06/16 1158  BP:  (!) 182/86 132/76 (!) 109/59  Pulse:  70 79   Resp: 19 18 18    Temp:  98.7 F (37.1 C)    TempSrc:  Oral    SpO2:  99% 95%   Weight:      Height:       Physical Exam: Gen: NAD CV: RRR         Lungs: CTAB Abd: +BS, obese, soft, non-tender, no rebound, no guarding Lochia: Not  visualized Uterine Fundus: firm, below umbilicus Incision: Dressing unchanged- clean and ry DVT Evaluation: no edema, no calf tenderness bilaterally  Labs: Lab Results  Component Value Date   WBC 17.8 (H) 12/04/2016   HGB 8.5 (L) 12/04/2016   HCT 25.8 (L) 12/04/2016   MCV 91.2 12/04/2016   PLT 398 12/04/2016   CMP Latest Ref Rng & Units 12/04/2016  Glucose 65 - 99 mg/dL 124(H)  BUN 6 - 20 mg/dL 20  Creatinine 0.44 - 1.00 mg/dL 0.81  Sodium 135 - 145 mmol/L 140  Potassium 3.5 - 5.1 mmol/L 3.6  Chloride 101 - 111 mmol/L 105  CO2 22 - 32 mmol/L 24  Calcium 8.9 - 10.3 mg/dL 8.5(L)  Total Protein 6.5 - 8.1 g/dL 5.8(L)  Total Bilirubin 0.3 - 1.2 mg/dL 0.6  Alkaline Phos 38 - 126 U/L 87  AST 15 - 41 U/L 31  ALT 14 - 54 U/L 16  Discharge instruction: per After Visit Summary and "Baby and Me Booklet".  After visit meds:  Allergies as of 12/06/2016   No Known Allergies     Medication List    TAKE these medications   butalbital-acetaminophen-caffeine 50-325-40 MG tablet Commonly known as:  FIORICET, ESGIC Take 1 tablet by mouth every 6 (six) hours as needed for headache.   escitalopram 20 MG tablet Commonly known as:  LEXAPRO Take 20 mg by mouth daily.   ibuprofen 600 MG tablet Commonly known as:  ADVIL,MOTRIN Take 1 tablet (600 mg total) by mouth every 6 (six) hours.   labetalol 300 MG tablet Commonly known as:  NORMODYNE Take 1 tablet (300 mg total) by mouth 2 (two) times daily. What changed:  You were already taking a medication with the same name, and this prescription was added. Make sure you understand how and when to take each.   labetalol 100 MG tablet Commonly known as:  NORMODYNE Take 100 mg by mouth daily. What changed:  Another medication with the same name was added. Make sure you understand how and when to take each.   NIFEdipine 30 MG 24 hr tablet Commonly known as:  PROCARDIA-XL/ADALAT CC Take 1 tablet (30 mg total) by mouth daily.   oxyCODONE 5  MG immediate release tablet Commonly known as:  Oxy IR/ROXICODONE Take 1 tablet (5 mg total) by mouth every 4 (four) hours as needed (pain scale 4-7).   PRENATAL VITAMIN PO Take 1 tablet by mouth 2 (two) times daily.            Discharge Care Instructions        Start     Ordered   12/06/16 0000  butalbital-acetaminophen-caffeine (FIORICET, ESGIC) 50-325-40 MG tablet  Every 6 hours PRN     12/06/16 0706   12/06/16 0000  labetalol (NORMODYNE) 300 MG tablet  2 times daily     12/06/16 0706   12/06/16 0000  NIFEdipine (PROCARDIA-XL/ADALAT CC) 30 MG 24 hr tablet  Daily     12/06/16 0706   12/03/16 0000  oxyCODONE (OXY IR/ROXICODONE) 5 MG immediate release tablet  Every 4 hours PRN     12/03/16 1124   12/03/16 0000  ibuprofen (ADVIL,MOTRIN) 600 MG tablet  Every 6 hours     12/03/16 1124   12/03/16 0000  Diet - low sodium heart healthy     12/03/16 1124   12/03/16 0000  Call MD for:  temperature >100.4     12/03/16 1124   12/03/16 0000  Call MD for:  persistant nausea and vomiting     12/03/16 1124   12/03/16 0000  Call MD for:  severe uncontrolled pain     12/03/16 1124   12/03/16 0000  Call MD for:  redness, tenderness, or signs of infection (pain, swelling, redness, odor or green/yellow discharge around incision site)     12/03/16 1124   12/03/16 0000  Call MD for:  difficulty breathing, headache or visual disturbances     12/03/16 1124   12/03/16 0000  Call MD for:  extreme fatigue     12/03/16 1124   12/03/16 0000  Lifting restrictions    Comments:  Weight restriction of 15 lbs.   12/03/16 1124   12/03/16 0000  Sexual acrtivity    Comments:  Pelvic rest x 6 weeks.   12/03/16 1124   12/03/16 0000  Discharge wound care:    Comments:  Keep incision clean and dry after dressing removed.   12/03/16  1124   12/01/16 0000  OB RESULT CONSOLE Group B Strep    Comments:  This external order was created through the Results Console.   12/01/16 0451      Diet: carb modified  diet  Activity: Advance as tolerated. Pelvic rest for 6 weeks.   Outpatient follow up: 1 week Follow up Appt:No future appointments. Follow up Visit:No Follow-up on file.  Postpartum contraception: IUD Mirena  Newborn Data: Live born female  Birth Weight: 6 lb 13.4 oz (3100 g) APGAR: 4, 7  Baby Feeding: Breast Disposition:home with mother   12/12/2016 Janyth Pupa, M, DO

## 2016-12-24 ENCOUNTER — Encounter (HOSPITAL_COMMUNITY): Payer: Self-pay

## 2016-12-24 ENCOUNTER — Inpatient Hospital Stay (HOSPITAL_COMMUNITY)
Admission: AD | Admit: 2016-12-24 | Discharge: 2016-12-24 | Disposition: A | Discharge status: Home / Self Care | Payer: 59 | Source: Ambulatory Visit | Attending: Obstetrics & Gynecology | Admitting: Obstetrics & Gynecology

## 2016-12-24 DIAGNOSIS — O900 Disruption of cesarean delivery wound: Secondary | ICD-10-CM | POA: Diagnosis present

## 2016-12-24 DIAGNOSIS — I10 Essential (primary) hypertension: Secondary | ICD-10-CM | POA: Diagnosis not present

## 2016-12-24 DIAGNOSIS — Z833 Family history of diabetes mellitus: Secondary | ICD-10-CM | POA: Insufficient documentation

## 2016-12-24 DIAGNOSIS — Z9889 Other specified postprocedural states: Secondary | ICD-10-CM | POA: Insufficient documentation

## 2016-12-24 DIAGNOSIS — F17200 Nicotine dependence, unspecified, uncomplicated: Secondary | ICD-10-CM | POA: Diagnosis not present

## 2016-12-24 DIAGNOSIS — Z8249 Family history of ischemic heart disease and other diseases of the circulatory system: Secondary | ICD-10-CM | POA: Insufficient documentation

## 2016-12-24 DIAGNOSIS — Z90721 Acquired absence of ovaries, unilateral: Secondary | ICD-10-CM | POA: Insufficient documentation

## 2016-12-24 NOTE — MAU Provider Note (Signed)
Admission History and Physical Exam for a Gynecology Patient  Loretta Brown is a 36 y.o. female, [id number], who presents for wound care. The patient had a cesarean section on 12/01/2016. She was seen by Dr. Nelda Marseille this past week. She was treated for a wound separation. The incision was opened and cleaned. Gauze was placed in the incision. The patient was told to come to the hospital today for wound care. She has been followed at [facility] obstetrics and gynecology.  OB History    Gravida Para Term Preterm AB Living   3 2 1 1 1 2    SAB TAB Ectopic Multiple Live Births   0   1 0 2      Past Medical History:  Diagnosis Date  . Attention deficit disorder   . Depression   . Hypertension     No prescriptions prior to admission.    Past Surgical History:  Procedure Laterality Date  . CESAREAN SECTION N/A 12/01/2016   Procedure: CESAREAN SECTION;  Surgeon: Janyth Pupa, DO;  Location: [place];  Service: Obstetrics;  Laterality: N/A;  . LAPAROSCOPY N/A 06/23/2015   Procedure: LAPAROSCOPY DIAGNOSTIC, Evacuation Hemoperitoneum;  Surgeon: Janyth Pupa, DO;  Location: [facility];  Service: Gynecology;  Laterality: N/A;  . NO PAST SURGERIES    . UNILATERAL SALPINGECTOMY Left 06/23/2015   Procedure: Left  SALPINGECTOMY with Removal Ectopic Pregnancy;  Surgeon: Janyth Pupa, DO;  Location: [facility];  Service: Gynecology;  Laterality: Left;    No Known Allergies  Family History: family history includes Diabetes in her paternal grandfather; Hypertension in her father and mother.  Social History:  reports that she has been smoking.  She has never used smokeless tobacco. She reports that she drinks alcohol. She reports that she uses drugs, including Marijuana.  Review of systems: See HPI.  Admission Physical Exam:    There is no height or weight on file to calculate BMI.  Blood pressure 101/65, pulse 90, temperature 98.1 F (36.7 C), temperature source Oral, resp. rate 16, currently  breastfeeding.  HEENT:                 Within normal limits Abdomen:             Nontender, no masses Incision: Small area of erythema and a small area of induration to the left of the midline. A 1 cm opening is noted in gauze is present. There is no evidence of purulence.   Procedure:  The incision was injected with 12 cc of 1% lidocaine. The gauze was removed. There was no evidence of purulence. The incision was clean with peroxide and saline. The incision was then rinsed with saline. A saline soaked gauze was placed back in the incision. The husband was trained to clean the incision at home.  Assessment:   Status post cesarean section  Wound separation  Plan:  I sent a prescription to [facility] on [address] for lidocaine gel. The husband was trained to clean the incision himself. The patient was given the option of having her husband clean the incision (if the patient tolerates) or to come to the hospital tomorrow to have the incision cleaned. The patient will see Dr. Nelda Marseille on October 1.   [name] 12/24/2016

## 2016-12-24 NOTE — Discharge Instructions (Signed)
Instructions given to clean incision. Return to MAU on 12/25/16 if desired. Prescription for Lidocaine gel sent to [facility].  Dr. Raphael Gibney

## 2016-12-24 NOTE — MAU Note (Signed)
Pt here to have incision repacked.  No fever. Having drainage from incision.

## 2017-05-18 ENCOUNTER — Ambulatory Visit: Payer: Self-pay | Admitting: General Surgery

## 2017-05-18 NOTE — H&P (Signed)
istory of Present Illness Ralene Ok MD; 05/18/2017 9:51 AM) The patient is a 37 year old female who presents with an incisional hernia. Referred by: Dr. Maury Dus Chief Complaint: Incisional hernia  Patient is a 37 year old female with a history of an emergent C-section in September 2018. According to the patient and her husband the patient had a infection at the incision site. They feel that approximately 1 month after C-section she began developing a hernia. This was confirmed her follow-up OB appointment. Since that time patient states that the hernia has gotten larger. She states that she's had some discomfort as well as some pain at times when standing, walking, activities. She states that she's tried some elastic band however is difficult secondary to the inferior area of the hernia.  Patient has not had any radiological studies. Patient had no signs or symptoms of incarceration or strangulation.    Past Surgical History Alean Rinne, [place]; 05/18/2017 9:30 AM) Cesarean Section - 1   Diagnostic Studies History Alean Rinne, [place]; 05/18/2017 9:30 AM) Colonoscopy  never Mammogram  never Pap Smear  1-5 years ago  Allergies Alean Rinne, RMA; 05/18/2017 9:31 AM) No Known Drug Allergies [05/18/2017]: Allergies Reconciled   Medication History Alean Rinne, [place]; 05/18/2017 9:32 AM) Terbinafine HCl (250MG  Tablet, Oral) Active. Labetalol HCl (200MG  Tablet, Oral) Active. Lexapro (20MG  Tablet, Oral) Active. Mirena (52 MG) (20MCG/24HR IUD, Intrauterine) Active. Medications Reconciled  Social History Alean Rinne, [place]; 05/18/2017 9:30 AM) Alcohol use  Occasional alcohol use. Caffeine use  Coffee. Illicit drug use  Prefer to discuss with provider. Tobacco use  Former smoker.  Family History Alean Rinne, [place]; 05/18/2017 9:30 AM) Alcohol Abuse  Father. Arthritis  Mother. Depression  Father, Mother. Hypertension  Father, Mother. Kidney Disease   Father.  Pregnancy / Birth History Alean Rinne, [place]; 05/18/2017 9:30 AM) Age at menarche  50 years. Contraceptive History  Intrauterine device. Gravida  3 Length (months) of breastfeeding  3-6 Maternal age  2-30 Para  2 Regular periods   Other Problems Alean Rinne, [place]; 05/18/2017 9:30 AM) Anxiety Disorder  Depression  Diabetes Mellitus  High blood pressure     Review of Systems Ralene Ok MD; 05/18/2017 9:50 AM) General Not Present- Appetite Loss, Chills, Fatigue, Fever, Night Sweats, Weight Gain and Weight Loss. Skin Not Present- Change in Wart/Mole, Dryness, Hives, Jaundice, New Lesions, Non-Healing Wounds, Rash and Ulcer. HEENT Present- Wears glasses/contact lenses. Not Present- Earache, Hearing Loss, Hoarseness, Nose Bleed, Oral Ulcers, Ringing in the Ears, Seasonal Allergies, Sinus Pain, Sore Throat, Visual Disturbances and Yellow Eyes. Respiratory Present- Snoring. Not Present- Bloody sputum, Chronic Cough, Difficulty Breathing and Wheezing. Breast Not Present- Breast Mass, Breast Pain, Nipple Discharge and Skin Changes. Cardiovascular Not Present- Chest Pain, Difficulty Breathing Lying Down, Leg Cramps, Palpitations, Rapid Heart Rate, Shortness of Breath and Swelling of Extremities. Gastrointestinal Not Present- Abdominal Pain, Bloating, Bloody Stool, Change in Bowel Habits, Chronic diarrhea, Constipation, Difficulty Swallowing, Excessive gas, Gets full quickly at meals, Hemorrhoids, Indigestion, Nausea, Rectal Pain and Vomiting. Female Genitourinary Not Present- Frequency, Nocturia, Painful Urination, Pelvic Pain and Urgency. Musculoskeletal Present- Joint Pain and Joint Stiffness. Not Present- Back Pain, Muscle Pain, Muscle Weakness and Swelling of Extremities. Neurological Present- Decreased Memory. Not Present- Fainting, Headaches, Numbness, Seizures, Tingling, Tremor, Trouble walking and Weakness. Psychiatric Present- Anxiety and Depression. Not  Present- Bipolar, Change in Sleep Pattern, Fearful and Frequent crying. Endocrine Not Present- Cold Intolerance, Excessive Hunger, Hair Changes, Heat Intolerance, Hot flashes and New Diabetes. Hematology Not Present- Blood  Thinners, Easy Bruising, Excessive bleeding, Gland problems, HIV and Persistent Infections. All other systems negative  Vitals Alean Rinne RMA; 05/18/2017 9:31 AM) 05/18/2017 9:31 AM Weight: 187.6 lb Height: 63in Body Surface Area: 1.88 m Body Mass Index: 33.23 kg/m  Temp.: 97.59F  Pulse: 97 (Regular)  BP: 130/78 (Sitting, Left Arm, Standard)       Physical Exam Ralene Ok MD; 05/18/2017 9:53 AM) The physical exam findings are as follows: Note:Constitutional: No acute distress, conversant, appears stated age  Eyes: Anicteric sclerae, moist conjunctiva, no lid lag  Neck: No thyromegaly, trachea midline, no cervical lymphadenopathy  Lungs: Clear to auscultation biilaterally, normal respiratory effot  Cardiovascular: regular rate & rhythm, no murmurs, no peripheal edema, pedal pulses 2+  GI: Soft, no masses or hepatosplenomegaly, non-tender to palpation  MSK: Normal gait, no clubbing cyanosis, edema  Skin: No rashes, palpation reveals normal skin turgor  Psychiatric: Appropriate judgment and insight, oriented to person, place, and time  Abdomen Inspection Hernias - Incisional - Reducible(Incisional hernia at the linea alba, appears to be approximately 5-8 cm in width. Reducible).    Assessment & Plan Ralene Ok MD; 05/18/2017 9:55 AM) Fatima Blank HERNIA, WITHOUT OBSTRUCTION OR GANGRENE (K43.2) Impression: 37 year old female with a incisional hernia following C-section, infection  1. The patient would like to proceed to the operative for exposure laparotomy and incisional hernia repair with mesh-retrorectus versus TAR approach. Patient will likely require resection of redundant skin at the hernia site. 2. Discussed with her the  risks and benefits of the procedure to include but not limited to: Infection, bleeding, damage to surrounding structures, possible recurrence, possible need further surgery. I discussed with her that I would not recommend any further pregnancies after the surgery . She feels that there are no plans for further pregnancies at this time.

## 2017-05-19 ENCOUNTER — Other Ambulatory Visit: Payer: Self-pay | Admitting: General Surgery

## 2017-05-19 DIAGNOSIS — K432 Incisional hernia without obstruction or gangrene: Secondary | ICD-10-CM

## 2017-06-06 ENCOUNTER — Other Ambulatory Visit: Payer: 59

## 2017-06-06 ENCOUNTER — Ambulatory Visit
Admission: RE | Admit: 2017-06-06 | Discharge: 2017-06-06 | Disposition: A | Discharge status: Home / Self Care | Payer: 59 | Source: Ambulatory Visit | Attending: General Surgery | Admitting: General Surgery

## 2017-06-06 DIAGNOSIS — K432 Incisional hernia without obstruction or gangrene: Secondary | ICD-10-CM

## 2017-07-12 NOTE — Pre-Procedure Instructions (Addendum)
Loretta Brown  07/12/2017      [id number] [address], [place] [address] Phone: [phone] Fax: [phone]    Your procedure is scheduled on July 21, 2017.  Report to [facility] Admitting at 915 am.  Call this number if you have problems the morning of surgery:  [phone]   Remember:  Do not eat food or drink liquids after midnight.  Take these medicines the morning of surgery with A SIP OF WATER  Labetalol (normodyne) escitalopram (lexapro) Nifedipine (procardia XL) Oxycodone-if needed for pain  Please complete your two bottles of PRE-SURGERY ENSURE the night before surgery and your 1 bottle of PRE-SURGERY ENSURE that was given to before you leave your house the morning of surgery.  Please, if able, drink it in one setting. DO NOT SIP.  7 days prior to surgery STOP taking any fioricet,  Aspirin (unless otherwise instructed by your surgeon), Aleve, Naproxen, Ibuprofen, Motrin, Advil, Goody's, BC's, all herbal medications, fish oil, and all vitamins  Continue all other medications as instructed by your physician except follow the above medication instructions before surgery   Contacts, dentures or bridgework may not be worn into surgery.  Leave your suitcase in the car.  After surgery it may be brought to your room.  For patients admitted to the hospital, discharge time will be determined by your treatment team.  Patients discharged the day of surgery will not be allowed to drive home.    [facility]- Preparing For Surgery  Before surgery, you can play an important role. Because skin is not sterile, your skin needs to be as free of germs as possible. You can reduce the number of germs on your skin by washing with CHG (chlorahexidine gluconate) Soap before surgery.  CHG is an antiseptic cleaner which kills germs and bonds with the skin to continue killing germs even after washing.  Please do  not use if you have an allergy to CHG or antibacterial soaps. If your skin becomes reddened/irritated stop using the CHG.  Do not shave (including legs and underarms) for at least 48 hours prior to first CHG shower. It is OK to shave your face.  Please follow these instructions carefully.   1. Shower the NIGHT BEFORE SURGERY and the MORNING OF SURGERY with CHG.   2. If you chose to wash your hair, wash your hair first as usual with your normal shampoo.  3. After you shampoo, rinse your hair and body thoroughly to remove the shampoo.  4. Use CHG as you would any other liquid soap. You can apply CHG directly to the skin and wash gently with a scrungie or a clean washcloth.   5. Apply the CHG Soap to your body ONLY FROM THE NECK DOWN.  Do not use on open wounds or open sores. Avoid contact with your eyes, ears, mouth and genitals (private parts). Wash Face and genitals (private parts)  with your normal soap.  6. Wash thoroughly, paying special attention to the area where your surgery will be performed.  7. Thoroughly rinse your body with warm water from the neck down.  8. DO NOT shower/wash with your normal soap after using and rinsing off the CHG Soap.  9. Pat yourself dry with a CLEAN TOWEL.  10. Wear CLEAN PAJAMAS to bed the night before surgery, wear comfortable clothes the morning of surgery  11. Place CLEAN SHEETS on your bed the night of  your first shower and DO NOT SLEEP WITH PETS.  Day of Surgery: Do not apply any deodorants/lotions. Please wear clean clothes to the hospital/surgery center.     Do not wear jewelry, make-up or nail polish.  Do not wear lotions, powders, or perfumes, or deodorant.  Do not shave 48 hours prior to surgery.  Do not bring valuables to the hospital.  [facility] is not responsible for any belongings or valuables.  Please read over the following fact sheets that you were given. Pain Booklet, Coughing and Deep Breathing and Surgical Site Infection  Prevention

## 2017-07-12 NOTE — Progress Notes (Addendum)
PCP: Maury Dus, MD  Cardiologist: pt denies  EKG: pt denies past year, obtained  Stress test: pt denies ever  ECHO: pt denies ever  Cardiac Cath: pt denies ever  Chest x-ray: pt denies past year, no recent respiratory complications/infections

## 2017-07-13 ENCOUNTER — Encounter (HOSPITAL_COMMUNITY)
Admission: RE | Admit: 2017-07-13 | Discharge: 2017-07-13 | Disposition: A | Discharge status: Home / Self Care | Payer: BC Managed Care – PPO | Source: Ambulatory Visit | Attending: General Surgery | Admitting: General Surgery

## 2017-07-13 ENCOUNTER — Other Ambulatory Visit: Payer: Self-pay

## 2017-07-13 ENCOUNTER — Encounter (HOSPITAL_COMMUNITY): Payer: Self-pay

## 2017-07-13 DIAGNOSIS — K432 Incisional hernia without obstruction or gangrene: Secondary | ICD-10-CM | POA: Insufficient documentation

## 2017-07-13 DIAGNOSIS — Z01812 Encounter for preprocedural laboratory examination: Secondary | ICD-10-CM | POA: Diagnosis present

## 2017-07-13 LAB — COMPREHENSIVE METABOLIC PANEL
ALBUMIN: 4.3 g/dL (ref 3.5–5.0)
ALK PHOS: 148 U/L — AB (ref 38–126)
ALT: 17 U/L (ref 14–54)
AST: 23 U/L (ref 15–41)
Anion gap: 10 (ref 5–15)
BUN: 9 mg/dL (ref 6–20)
CALCIUM: 9 mg/dL (ref 8.9–10.3)
CO2: 23 mmol/L (ref 22–32)
CREATININE: 0.85 mg/dL (ref 0.44–1.00)
Chloride: 106 mmol/L (ref 101–111)
GFR calc Af Amer: >60 mL/min (ref >60)
GFR calc non Af Amer: >60 mL/min (ref >60)
Glucose, Bld: 86 mg/dL (ref 65–99)
Potassium: 4 mmol/L (ref 3.5–5.1)
SODIUM: 139 mmol/L (ref 135–145)
Total Bilirubin: 0.5 mg/dL (ref 0.3–1.2)
Total Protein: 7.2 g/dL (ref 6.5–8.1)

## 2017-07-13 LAB — CBC
HCT: 41.9 % (ref 36.0–46.0)
HEMOGLOBIN: 14 g/dL (ref 12.0–15.0)
MCH: 30.1 pg (ref 26.0–34.0)
MCHC: 33.4 g/dL (ref 30.0–36.0)
MCV: 90.1 fL (ref 78.0–100.0)
Platelets: 460 10*3/uL — ABNORMAL HIGH (ref 150–400)
RBC: 4.65 MIL/uL (ref 3.87–5.11)
RDW: 13.9 % (ref 11.5–15.5)
WBC: 10.3 10*3/uL (ref 4.0–10.5)

## 2017-07-13 MED ORDER — CHLORHEXIDINE GLUCONATE CLOTH 2 % EX PADS: 6.0000 | MEDICATED_PAD | Freq: Once | CUTANEOUS | Status: DC

## 2017-07-21 ENCOUNTER — Encounter (HOSPITAL_COMMUNITY)
Admission: RE | Disposition: A | Discharge status: Home / Self Care | Payer: Self-pay | Source: Ambulatory Visit | Attending: General Surgery

## 2017-07-21 ENCOUNTER — Inpatient Hospital Stay (HOSPITAL_COMMUNITY)
Admission: RE | Admit: 2017-07-21 | Discharge: 2017-07-23 | DRG: 355 | Disposition: A | Discharge status: Home / Self Care | Payer: BC Managed Care – PPO | Source: Ambulatory Visit | Attending: General Surgery | Admitting: General Surgery

## 2017-07-21 ENCOUNTER — Ambulatory Visit (HOSPITAL_COMMUNITY): Payer: BC Managed Care – PPO | Admitting: Vascular Surgery

## 2017-07-21 ENCOUNTER — Encounter (HOSPITAL_COMMUNITY): Payer: Self-pay | Admitting: Certified Registered Nurse Anesthetist

## 2017-07-21 ENCOUNTER — Other Ambulatory Visit: Payer: Self-pay

## 2017-07-21 ENCOUNTER — Ambulatory Visit (HOSPITAL_COMMUNITY): Payer: BC Managed Care – PPO | Admitting: Certified Registered Nurse Anesthetist

## 2017-07-21 DIAGNOSIS — Z87891 Personal history of nicotine dependence: Secondary | ICD-10-CM

## 2017-07-21 DIAGNOSIS — Z8719 Personal history of other diseases of the digestive system: Secondary | ICD-10-CM

## 2017-07-21 DIAGNOSIS — I10 Essential (primary) hypertension: Secondary | ICD-10-CM | POA: Diagnosis present

## 2017-07-21 DIAGNOSIS — Z8261 Family history of arthritis: Secondary | ICD-10-CM

## 2017-07-21 DIAGNOSIS — Z8249 Family history of ischemic heart disease and other diseases of the circulatory system: Secondary | ICD-10-CM

## 2017-07-21 DIAGNOSIS — K432 Incisional hernia without obstruction or gangrene: Principal | ICD-10-CM | POA: Diagnosis present

## 2017-07-21 DIAGNOSIS — Z9889 Other specified postprocedural states: Secondary | ICD-10-CM

## 2017-07-21 DIAGNOSIS — Z841 Family history of disorders of kidney and ureter: Secondary | ICD-10-CM

## 2017-07-21 DIAGNOSIS — F329 Major depressive disorder, single episode, unspecified: Secondary | ICD-10-CM | POA: Diagnosis present

## 2017-07-21 DIAGNOSIS — Z833 Family history of diabetes mellitus: Secondary | ICD-10-CM

## 2017-07-21 DIAGNOSIS — Z98891 History of uterine scar from previous surgery: Secondary | ICD-10-CM

## 2017-07-21 DIAGNOSIS — F419 Anxiety disorder, unspecified: Secondary | ICD-10-CM | POA: Diagnosis present

## 2017-07-21 HISTORY — PX: INCISIONAL HERNIA REPAIR: SHX193

## 2017-07-21 HISTORY — PX: INSERTION OF MESH: SHX5868

## 2017-07-21 HISTORY — PX: LAPAROTOMY: SHX154

## 2017-07-21 LAB — POCT PREGNANCY, URINE: PREG TEST UR: NEGATIVE

## 2017-07-21 SURGERY — LAPAROTOMY, EXPLORATORY
Anesthesia: General | Site: Abdomen

## 2017-07-21 MED ORDER — ONDANSETRON HCL 4 MG/2ML IJ SOLN
INTRAMUSCULAR | Status: AC
  Filled 2017-07-21: qty 2

## 2017-07-21 MED ORDER — ACETAMINOPHEN 500 MG PO TABS
1000.0000 mg | ORAL_TABLET | ORAL | Status: AC
  Administered 2017-07-21: 1000 mg via ORAL
  Filled 2017-07-21: qty 2

## 2017-07-21 MED ORDER — GLYCOPYRROLATE 0.2 MG/ML IJ SOLN
INTRAMUSCULAR | Status: DC | PRN
  Administered 2017-07-21: 0.2 mg via INTRAVENOUS
  Administered 2017-07-21: 0.6 mg via INTRAVENOUS

## 2017-07-21 MED ORDER — MIDAZOLAM HCL 5 MG/5ML IJ SOLN
INTRAMUSCULAR | Status: DC | PRN
  Administered 2017-07-21: 2 mg via INTRAVENOUS

## 2017-07-21 MED ORDER — BUPIVACAINE LIPOSOME 1.3 % IJ SUSP
20.0000 mL | INTRAMUSCULAR | Status: DC
  Filled 2017-07-21: qty 20

## 2017-07-21 MED ORDER — GABAPENTIN 300 MG PO CAPS
300.0000 mg | ORAL_CAPSULE | ORAL | Status: AC
  Administered 2017-07-21: 300 mg via ORAL
  Filled 2017-07-21: qty 1

## 2017-07-21 MED ORDER — DIPHENHYDRAMINE HCL 50 MG/ML IJ SOLN
INTRAMUSCULAR | Status: DC | PRN
  Administered 2017-07-21: 12.5 mg via INTRAVENOUS

## 2017-07-21 MED ORDER — SODIUM CHLORIDE 0.9 % IV SOLN
INTRAVENOUS | Status: DC | PRN
  Administered 2017-07-21: 13:00:00

## 2017-07-21 MED ORDER — NEOSTIGMINE METHYLSULFATE 5 MG/5ML IV SOSY
PREFILLED_SYRINGE | INTRAVENOUS | Status: AC
  Filled 2017-07-21: qty 5

## 2017-07-21 MED ORDER — ONDANSETRON 4 MG PO TBDP: 4.0000 mg | ORAL_TABLET | Freq: Four times a day (QID) | ORAL | Status: DC | PRN

## 2017-07-21 MED ORDER — CELECOXIB 200 MG PO CAPS
200.0000 mg | ORAL_CAPSULE | Freq: Two times a day (BID) | ORAL | Status: DC
  Administered 2017-07-21 – 2017-07-23 (×4): 200 mg via ORAL
  Filled 2017-07-21 (×4): qty 1

## 2017-07-21 MED ORDER — CEFAZOLIN SODIUM-DEXTROSE 2-4 GM/100ML-% IV SOLN
2.0000 g | INTRAVENOUS | Status: AC
  Administered 2017-07-21: 2 g via INTRAVENOUS
  Filled 2017-07-21: qty 100

## 2017-07-21 MED ORDER — DEXTROSE-NACL 5-0.9 % IV SOLN
INTRAVENOUS | Status: DC
  Administered 2017-07-21: 1 mL via INTRAVENOUS
  Administered 2017-07-22 – 2017-07-23 (×3): via INTRAVENOUS

## 2017-07-21 MED ORDER — SUCCINYLCHOLINE CHLORIDE 200 MG/10ML IV SOSY
PREFILLED_SYRINGE | INTRAVENOUS | Status: AC
  Filled 2017-07-21: qty 10

## 2017-07-21 MED ORDER — NEOSTIGMINE METHYLSULFATE 10 MG/10ML IV SOLN
INTRAVENOUS | Status: DC | PRN
  Administered 2017-07-21: 4 mg via INTRAVENOUS

## 2017-07-21 MED ORDER — ROCURONIUM BROMIDE 10 MG/ML (PF) SYRINGE
PREFILLED_SYRINGE | INTRAVENOUS | Status: AC
  Filled 2017-07-21: qty 5

## 2017-07-21 MED ORDER — SUCCINYLCHOLINE CHLORIDE 200 MG/10ML IV SOSY
PREFILLED_SYRINGE | INTRAVENOUS | Status: DC | PRN
  Administered 2017-07-21: 120 mg via INTRAVENOUS

## 2017-07-21 MED ORDER — LIDOCAINE HCL (CARDIAC) PF 100 MG/5ML IV SOSY
PREFILLED_SYRINGE | INTRAVENOUS | Status: DC | PRN
  Administered 2017-07-21: 100 mg via INTRAVENOUS

## 2017-07-21 MED ORDER — POLYETHYLENE GLYCOL 3350 17 G PO PACK: 17.0000 g | PACK | Freq: Every day | ORAL | Status: DC | PRN

## 2017-07-21 MED ORDER — GABAPENTIN 300 MG PO CAPS
300.0000 mg | ORAL_CAPSULE | Freq: Two times a day (BID) | ORAL | Status: DC
  Administered 2017-07-21 – 2017-07-23 (×4): 300 mg via ORAL
  Filled 2017-07-21 (×4): qty 1

## 2017-07-21 MED ORDER — KETOROLAC TROMETHAMINE 30 MG/ML IJ SOLN
INTRAMUSCULAR | Status: AC
  Filled 2017-07-21: qty 1

## 2017-07-21 MED ORDER — LIDOCAINE 2% (20 MG/ML) 5 ML SYRINGE
INTRAMUSCULAR | Status: AC
  Filled 2017-07-21: qty 5

## 2017-07-21 MED ORDER — MIDAZOLAM HCL 2 MG/2ML IJ SOLN
INTRAMUSCULAR | Status: AC
  Filled 2017-07-21: qty 2

## 2017-07-21 MED ORDER — FENTANYL CITRATE (PF) 100 MCG/2ML IJ SOLN
25.0000 ug | INTRAMUSCULAR | Status: DC | PRN
  Administered 2017-07-21 (×3): 50 ug via INTRAVENOUS

## 2017-07-21 MED ORDER — ENSURE PRE-SURGERY PO LIQD: 296.0000 mL | Freq: Once | ORAL | Status: DC

## 2017-07-21 MED ORDER — ONDANSETRON HCL 4 MG/2ML IJ SOLN: 4.0000 mg | Freq: Four times a day (QID) | INTRAMUSCULAR | Status: DC | PRN

## 2017-07-21 MED ORDER — PROPOFOL 10 MG/ML IV BOLUS
INTRAVENOUS | Status: DC | PRN
  Administered 2017-07-21: 150 mg via INTRAVENOUS
  Administered 2017-07-21: 50 mg via INTRAVENOUS

## 2017-07-21 MED ORDER — FENTANYL CITRATE (PF) 100 MCG/2ML IJ SOLN
INTRAMUSCULAR | Status: DC | PRN
  Administered 2017-07-21 (×4): 50 ug via INTRAVENOUS
  Administered 2017-07-21: 100 ug via INTRAVENOUS
  Administered 2017-07-21 (×4): 50 ug via INTRAVENOUS

## 2017-07-21 MED ORDER — TRAMADOL HCL 50 MG PO TABS
50.0000 mg | ORAL_TABLET | Freq: Four times a day (QID) | ORAL | Status: DC | PRN
  Administered 2017-07-21 – 2017-07-23 (×6): 50 mg via ORAL
  Filled 2017-07-21 (×6): qty 1

## 2017-07-21 MED ORDER — DEXAMETHASONE SODIUM PHOSPHATE 10 MG/ML IJ SOLN
INTRAMUSCULAR | Status: AC
  Filled 2017-07-21: qty 1

## 2017-07-21 MED ORDER — ONDANSETRON HCL 4 MG/2ML IJ SOLN
INTRAMUSCULAR | Status: DC | PRN
  Administered 2017-07-21: 4 mg via INTRAVENOUS

## 2017-07-21 MED ORDER — DIPHENHYDRAMINE HCL 50 MG/ML IJ SOLN
INTRAMUSCULAR | Status: AC
  Filled 2017-07-21: qty 1

## 2017-07-21 MED ORDER — DEXAMETHASONE SODIUM PHOSPHATE 10 MG/ML IJ SOLN
INTRAMUSCULAR | Status: DC | PRN
  Administered 2017-07-21: 10 mg via INTRAVENOUS

## 2017-07-21 MED ORDER — KETOROLAC TROMETHAMINE 30 MG/ML IJ SOLN
INTRAMUSCULAR | Status: DC | PRN
  Administered 2017-07-21: 30 mg via INTRAVENOUS

## 2017-07-21 MED ORDER — HYDROMORPHONE HCL 2 MG/ML IJ SOLN
1.0000 mg | INTRAMUSCULAR | Status: DC | PRN
  Administered 2017-07-21 – 2017-07-22 (×2): 1 mg via INTRAVENOUS
  Filled 2017-07-21 (×2): qty 1

## 2017-07-21 MED ORDER — 0.9 % SODIUM CHLORIDE (POUR BTL) OPTIME
TOPICAL | Status: DC | PRN
  Administered 2017-07-21 (×3): 1000 mL

## 2017-07-21 MED ORDER — ROCURONIUM BROMIDE 100 MG/10ML IV SOLN
INTRAVENOUS | Status: DC | PRN
  Administered 2017-07-21 (×2): 20 mg via INTRAVENOUS
  Administered 2017-07-21: 60 mg via INTRAVENOUS

## 2017-07-21 MED ORDER — EVICEL 5 ML EX KIT
PACK | CUTANEOUS | Status: AC
  Filled 2017-07-21: qty 2

## 2017-07-21 MED ORDER — METHOCARBAMOL 500 MG PO TABS
500.0000 mg | ORAL_TABLET | Freq: Four times a day (QID) | ORAL | Status: DC | PRN
  Administered 2017-07-21 – 2017-07-23 (×7): 500 mg via ORAL
  Filled 2017-07-21 (×8): qty 1

## 2017-07-21 MED ORDER — ENSURE PRE-SURGERY PO LIQD: 592.0000 mL | Freq: Once | ORAL | Status: DC

## 2017-07-21 MED ORDER — EVICEL 5 ML EX KIT
PACK | CUTANEOUS | Status: DC | PRN
  Administered 2017-07-21 (×2): 5 mL via TOPICAL

## 2017-07-21 MED ORDER — HYDRALAZINE HCL 20 MG/ML IJ SOLN: 10.0000 mg | INTRAMUSCULAR | Status: DC | PRN

## 2017-07-21 MED ORDER — FENTANYL CITRATE (PF) 250 MCG/5ML IJ SOLN
INTRAMUSCULAR | Status: AC
  Filled 2017-07-21: qty 5

## 2017-07-21 MED ORDER — CELECOXIB 200 MG PO CAPS
200.0000 mg | ORAL_CAPSULE | ORAL | Status: AC
  Administered 2017-07-21: 200 mg via ORAL
  Filled 2017-07-21: qty 1

## 2017-07-21 MED ORDER — LACTATED RINGERS IV SOLN
INTRAVENOUS | Status: DC
  Administered 2017-07-21 (×2): via INTRAVENOUS

## 2017-07-21 MED ORDER — FENTANYL CITRATE (PF) 100 MCG/2ML IJ SOLN
INTRAMUSCULAR | Status: AC
Start: 2017-07-21 — End: 2017-07-22
  Filled 2017-07-21: qty 2

## 2017-07-21 MED ORDER — PROPOFOL 10 MG/ML IV BOLUS
INTRAVENOUS | Status: AC
  Filled 2017-07-21: qty 20

## 2017-07-21 MED ORDER — PHENYLEPHRINE 40 MCG/ML (10ML) SYRINGE FOR IV PUSH (FOR BLOOD PRESSURE SUPPORT)
PREFILLED_SYRINGE | INTRAVENOUS | Status: AC
  Filled 2017-07-21: qty 10

## 2017-07-21 MED ORDER — ENOXAPARIN SODIUM 40 MG/0.4ML ~~LOC~~ SOLN
40.0000 mg | SUBCUTANEOUS | Status: DC
  Administered 2017-07-22 – 2017-07-23 (×2): 40 mg via SUBCUTANEOUS
  Filled 2017-07-21 (×2): qty 0.4

## 2017-07-21 MED ORDER — FENTANYL CITRATE (PF) 100 MCG/2ML IJ SOLN
INTRAMUSCULAR | Status: AC
  Administered 2017-07-21: 50 ug via INTRAVENOUS
  Filled 2017-07-21: qty 2

## 2017-07-21 SURGICAL SUPPLY — 69 items
BINDER ABDOMINAL 12 ML 46-62 (SOFTGOODS) ×6 IMPLANT
BIOPATCH RED 1 DISK 7.0 (GAUZE/BANDAGES/DRESSINGS) ×2 IMPLANT
BIOPATCH RED 1IN DISK 7.0MM (GAUZE/BANDAGES/DRESSINGS) ×1
BLADE CLIPPER SURG (BLADE) ×3 IMPLANT
BLADE SURG 15 STRL LF DISP TIS (BLADE) ×1 IMPLANT
BLADE SURG 15 STRL SS (BLADE) ×2
CANISTER SUCT 3000ML PPV (MISCELLANEOUS) ×3 IMPLANT
CHLORAPREP W/TINT 26ML (MISCELLANEOUS) ×3 IMPLANT
COVER SURGICAL LIGHT HANDLE (MISCELLANEOUS) ×3 IMPLANT
DERMABOND ADVANCED (GAUZE/BANDAGES/DRESSINGS) ×2
DERMABOND ADVANCED .7 DNX12 (GAUZE/BANDAGES/DRESSINGS) ×1 IMPLANT
DEVICE TROCAR PUNCTURE CLOSURE (ENDOMECHANICALS) ×3 IMPLANT
DRAIN CHANNEL 19F RND (DRAIN) ×3 IMPLANT
DRAPE INCISE IOBAN 66X45 STRL (DRAPES) ×3 IMPLANT
DRAPE LAPAROSCOPIC ABDOMINAL (DRAPES) ×3 IMPLANT
DRAPE WARM FLUID 44X44 (DRAPE) ×3 IMPLANT
DRSG OPSITE POSTOP 4X10 (GAUZE/BANDAGES/DRESSINGS) ×3 IMPLANT
DRSG OPSITE POSTOP 4X8 (GAUZE/BANDAGES/DRESSINGS) ×3 IMPLANT
DRSG TEGADERM 2-3/8X2-3/4 SM (GAUZE/BANDAGES/DRESSINGS) ×3 IMPLANT
ELECT BLADE 6.5 EXT (BLADE) ×6 IMPLANT
ELECT CAUTERY BLADE 6.4 (BLADE) ×3 IMPLANT
ELECT REM PT RETURN 9FT ADLT (ELECTROSURGICAL) ×3
ELECTRODE REM PT RTRN 9FT ADLT (ELECTROSURGICAL) ×1 IMPLANT
EVACUATOR SILICONE 100CC (DRAIN) ×3 IMPLANT
GAUZE SPONGE 4X4 12PLY STRL (GAUZE/BANDAGES/DRESSINGS) IMPLANT
GLOVE BIO SURGEON STRL SZ7.5 (GLOVE) ×3 IMPLANT
GLOVE BIOGEL PI IND STRL 6.5 (GLOVE) ×1 IMPLANT
GLOVE BIOGEL PI IND STRL 7.5 (GLOVE) ×1 IMPLANT
GLOVE BIOGEL PI IND STRL 8 (GLOVE) ×3 IMPLANT
GLOVE BIOGEL PI INDICATOR 6.5 (GLOVE) ×2
GLOVE BIOGEL PI INDICATOR 7.5 (GLOVE) ×2
GLOVE BIOGEL PI INDICATOR 8 (GLOVE) ×6
GLOVE ECLIPSE 7.5 STRL STRAW (GLOVE) ×3 IMPLANT
GLOVE SURG SS PI 6.5 STRL IVOR (GLOVE) ×6 IMPLANT
GOWN STRL REUS W/ TWL LRG LVL3 (GOWN DISPOSABLE) ×3 IMPLANT
GOWN STRL REUS W/ TWL XL LVL3 (GOWN DISPOSABLE) ×1 IMPLANT
GOWN STRL REUS W/TWL LRG LVL3 (GOWN DISPOSABLE) ×6
GOWN STRL REUS W/TWL XL LVL3 (GOWN DISPOSABLE) ×2
KIT BASIN OR (CUSTOM PROCEDURE TRAY) ×3 IMPLANT
KIT TURNOVER KIT B (KITS) ×3 IMPLANT
LIGASURE IMPACT 36 18CM CVD LR (INSTRUMENTS) IMPLANT
MESH ULTRAPRO 12X12 30X30CM (Mesh General) ×3 IMPLANT
NEEDLE HYPO 25GX1X1/2 BEV (NEEDLE) ×3 IMPLANT
NS IRRIG 1000ML POUR BTL (IV SOLUTION) ×6 IMPLANT
PACK GENERAL/GYN (CUSTOM PROCEDURE TRAY) ×3 IMPLANT
PAD ARMBOARD 7.5X6 YLW CONV (MISCELLANEOUS) ×3 IMPLANT
RETAINER VISCERA MED (MISCELLANEOUS) ×3 IMPLANT
SPECIMEN JAR LARGE (MISCELLANEOUS) IMPLANT
SPONGE LAP 18X18 X RAY DECT (DISPOSABLE) ×6 IMPLANT
STAPLER VISISTAT 35W (STAPLE) ×3 IMPLANT
SUCTION POOLE TIP (SUCTIONS) ×3 IMPLANT
SUT ETHILON 3 0 FSL (SUTURE) ×6 IMPLANT
SUT NOVA NAB GS-21 0 18 T12 DT (SUTURE) ×9 IMPLANT
SUT PDS AB 0 CT 36 (SUTURE) IMPLANT
SUT PDS AB 1 TP1 96 (SUTURE) IMPLANT
SUT PDS AB 2-0 CT1 27 (SUTURE) ×6 IMPLANT
SUT SILK 2 0 SH CR/8 (SUTURE) ×3 IMPLANT
SUT SILK 2 0 TIES 10X30 (SUTURE) ×3 IMPLANT
SUT SILK 3 0 SH CR/8 (SUTURE) ×3 IMPLANT
SUT SILK 3 0 TIES 10X30 (SUTURE) ×3 IMPLANT
SUT VIC AB 2-0 SH 18 (SUTURE) ×6 IMPLANT
SUT VICRYL AB 2 0 TIES (SUTURE) IMPLANT
SYR CONTROL 10ML LL (SYRINGE) ×3 IMPLANT
TIP RIGID 35CM EVICEL (HEMOSTASIS) ×6 IMPLANT
TOWEL OR 17X24 6PK STRL BLUE (TOWEL DISPOSABLE) IMPLANT
TOWEL OR 17X26 10 PK STRL BLUE (TOWEL DISPOSABLE) ×3 IMPLANT
TRAY FOLEY CATH SILVER 16FR (SET/KITS/TRAYS/PACK) IMPLANT
TRAY FOLEY W/METER SILVER 16FR (SET/KITS/TRAYS/PACK) ×3 IMPLANT
YANKAUER SUCT BULB TIP NO VENT (SUCTIONS) IMPLANT

## 2017-07-21 NOTE — Anesthesia Preprocedure Evaluation (Signed)
Anesthesia Evaluation  Patient identified by MRN, date of birth, ID band Patient awake    Reviewed: Allergy & Precautions, NPO status , Patient's Chart, lab work & pertinent test results  Airway Mallampati: II  TM Distance: >3 FB     Dental   Pulmonary former smoker,    breath sounds clear to auscultation       Cardiovascular hypertension,  Rhythm:Regular Rate:Normal     Neuro/Psych    GI/Hepatic Neg liver ROS, History noted. CG   Endo/Other  negative endocrine ROS  Renal/GU negative Renal ROS     Musculoskeletal   Abdominal   Peds  Hematology   Anesthesia Other Findings   Reproductive/Obstetrics                             Anesthesia Physical Anesthesia Plan  ASA: III  Anesthesia Plan: General   Post-op Pain Management:    Induction: Intravenous  PONV Risk Score and Plan: 3 and Treatment may vary due to age or medical condition  Airway Management Planned: Oral ETT  Additional Equipment:   Intra-op Plan:   Post-operative Plan: Extubation in OR  Informed Consent: I have reviewed the patients History and Physical, chart, labs and discussed the procedure including the risks, benefits and alternatives for the proposed anesthesia with the patient or authorized representative who has indicated his/her understanding and acceptance.   Dental advisory given  Plan Discussed with: CRNA and Anesthesiologist  Anesthesia Plan Comments:         Anesthesia Quick Evaluation

## 2017-07-21 NOTE — Transfer of Care (Signed)
Immediate Anesthesia Transfer of Care Note  Patient: Loretta Brown  Procedure(s) Performed: EXPLORATORY LAPAROTOMY ERAS PATHWAY (N/A Abdomen) INCISIONAL HERNIA REPAIR (N/A Abdomen) INSERTION OF MESH (N/A Abdomen)  Patient Location: PACU  Anesthesia Type:General  Level of Consciousness: awake, alert  and oriented  Airway & Oxygen Therapy: Patient Spontanous Breathing and Patient connected to nasal cannula oxygen  Post-op Assessment: Report given to RN and Post -op Vital signs reviewed and stable  Post vital signs: Reviewed and stable  Last Vitals:  Vitals Value Taken Time  BP 162/95 07/21/2017  2:20 PM  Temp    Pulse 64 07/21/2017  2:22 PM  Resp 14 07/21/2017  2:22 PM  SpO2 94 % 07/21/2017  2:22 PM  Vitals shown include unvalidated device data.  Last Pain:  Vitals:   07/21/17 0936  TempSrc:   PainSc: 1          Complications: No apparent anesthesia complications

## 2017-07-21 NOTE — H&P (Signed)
History of Present Illness  The patient is a 37 year old female who presents with an incisional hernia. Referred by: Dr. Maury Dus Chief Complaint: Incisional hernia  Patient is a 37 year old female with a history of an emergent C-section in September 2018. According to the patient and her husband the patient had a infection at the incision site. They feel that approximately 1 month after C-section she began developing a hernia. This was confirmed her follow-up OB appointment. Since that time patient states that the hernia has gotten larger. She states that she's had some discomfort as well as some pain at times when standing, walking, activities. She states that she's tried some elastic band however is difficult secondary to the inferior area of the hernia.  Patient has not had any radiological studies. Patient had no signs or symptoms of incarceration or strangulation.    Past Surgical History  Cesarean Section - 1   Diagnostic Studies History  Colonoscopy  never Mammogram  never Pap Smear  1-5 years ago  Allergies  No Known Drug Allergies [05/18/2017]: Allergies Reconciled   Medication History Terbinafine HCl (250MG  Tablet, Oral) Active. Labetalol HCl (200MG  Tablet, Oral) Active. Lexapro (20MG  Tablet, Oral) Active. Mirena (52 MG) (20MCG/24HR IUD, Intrauterine) Active. Medications Reconciled  Social History  Alcohol use  Occasional alcohol use. Caffeine use  Coffee. Illicit drug use  Prefer to discuss with provider. Tobacco use  Former smoker.  Family History  Alcohol Abuse  Father. Arthritis  Mother. Depression  Father, Mother. Hypertension  Father, Mother. Kidney Disease  Father.  Pregnancy / Birth History Age at menarche  34 years. Contraceptive History  Intrauterine device. Gravida  3 Length (months) of breastfeeding  3-6 Maternal age  61-30 Para  2 Regular periods   Other Problems  Anxiety Disorder   Depression  Diabetes Mellitus  High blood pressure     Review of Systems  General Not Present- Appetite Loss, Chills, Fatigue, Fever, Night Sweats, Weight Gain and Weight Loss. Skin Not Present- Change in Wart/Mole, Dryness, Hives, Jaundice, New Lesions, Non-Healing Wounds, Rash and Ulcer. HEENT Present- Wears glasses/contact lenses. Not Present- Earache, Hearing Loss, Hoarseness, Nose Bleed, Oral Ulcers, Ringing in the Ears, Seasonal Allergies, Sinus Pain, Sore Throat, Visual Disturbances and Yellow Eyes. Respiratory Present- Snoring. Not Present- Bloody sputum, Chronic Cough, Difficulty Breathing and Wheezing. Breast Not Present- Breast Mass, Breast Pain, Nipple Discharge and Skin Changes. Cardiovascular Not Present- Chest Pain, Difficulty Breathing Lying Down, Leg Cramps, Palpitations, Rapid Heart Rate, Shortness of Breath and Swelling of Extremities. Gastrointestinal Not Present- Abdominal Pain, Bloating, Bloody Stool, Change in Bowel Habits, Chronic diarrhea, Constipation, Difficulty Swallowing, Excessive gas, Gets full quickly at meals, Hemorrhoids, Indigestion, Nausea, Rectal Pain and Vomiting. Female Genitourinary Not Present- Frequency, Nocturia, Painful Urination, Pelvic Pain and Urgency. Musculoskeletal Present- Joint Pain and Joint Stiffness. Not Present- Back Pain, Muscle Pain, Muscle Weakness and Swelling of Extremities. Neurological Present- Decreased Memory. Not Present- Fainting, Headaches, Numbness, Seizures, Tingling, Tremor, Trouble walking and Weakness. Psychiatric Present- Anxiety and Depression. Not Present- Bipolar, Change in Sleep Pattern, Fearful and Frequent crying. Endocrine Not Present- Cold Intolerance, Excessive Hunger, Hair Changes, Heat Intolerance, Hot flashes and New Diabetes. Hematology Not Present- Blood Thinners, Easy Bruising, Excessive bleeding, Gland problems, HIV and Persistent Infections. All other systems negative  BP (!) 178/103   Pulse 65    Temp 97.6 F (36.4 C) (Oral)   Resp 20   Ht 5\' 3"  (1.6 m)   Wt 82.6 kg (182 lb)   SpO2  98%   Breastfeeding? No   BMI 32.24 kg/m     Physical Exam The physical exam findings are as follows: Note:Constitutional: No acute distress, conversant, appears stated age  Eyes: Anicteric sclerae, moist conjunctiva, no lid lag  Neck: No thyromegaly, trachea midline, no cervical lymphadenopathy  Lungs: Clear to auscultation biilaterally, normal respiratory effot  Cardiovascular: regular rate & rhythm, no murmurs, no peripheal edema, pedal pulses 2+  GI: Soft, no masses or hepatosplenomegaly, non-tender to palpation  MSK: Normal gait, no clubbing cyanosis, edema  Skin: No rashes, palpation reveals normal skin turgor  Psychiatric: Appropriate judgment and insight, oriented to person, place, and time  Abdomen Inspection Hernias - Incisional - Reducible(Incisional hernia at the linea alba, appears to be approximately 5-8 cm in width. Reducible).    Assessment & Plan  INCISIONAL HERNIA, WITHOUT OBSTRUCTION OR GANGRENE (K43.2) Impression: 37 year old female with a incisional hernia following C-section, infection  1. The patient would like to proceed to the operative for exposure laparotomy and incisional hernia repair with mesh-retrorectus versus TAR approach. Patient will likely require resection of redundant skin at the hernia site. 2. Discussed with her the risks and benefits of the procedure to include but not limited to: Infection, bleeding, damage to surrounding structures, possible recurrence, possible need further surgery. I discussed with her that I would not recommend any further pregnancies after the surgery . She feels that there are no plans for further pregnancies at this time.

## 2017-07-21 NOTE — Anesthesia Postprocedure Evaluation (Signed)
Anesthesia Post Note  Patient: Loretta Brown  Procedure(s) Performed: EXPLORATORY LAPAROTOMY ERAS PATHWAY (N/A Abdomen) INCISIONAL HERNIA REPAIR (N/A Abdomen) INSERTION OF MESH (N/A Abdomen)     Anesthesia Post Evaluation  Last Vitals:  Vitals:   07/21/17 1420 07/21/17 1430  BP: (!) 162/95   Pulse: 63 (!) 59  Resp: 14 17  Temp:    SpO2: 97% 98%    Last Pain:  Vitals:   07/21/17 1417  TempSrc:   PainSc: 0-No pain                 Aariya Ferrick

## 2017-07-21 NOTE — Op Note (Signed)
07/21/2017  1:49 PM  PATIENT:  Loretta Brown  37 y.o. female  PRE-OPERATIVE DIAGNOSIS:  incisional hernia  POST-OPERATIVE DIAGNOSIS:  incisional hernia  PROCEDURE:  Procedure(s): EXPLORATORY LAPAROTOMY (N/A) INCISIONAL HERNIA REPAIR, RETRORECTUS (N/A) INSERTION OF MESH Abdominoplasty (N/A)  SURGEON:  Surgeon(s) and Role:    Ralene Ok, MD - Primary  ANESTHESIA:   local, regional and general  EBL:  75 mL   BLOOD ADMINISTERED:none  DRAINS: (29fr) Jackson-Pratt drain(s) with closed bulb suction in the SQ/ hernia sac RLQ   LOCAL MEDICATIONS USED:  OTHER exparil  SPECIMEN:  No Specimen  DISPOSITION OF SPECIMEN:  N/A  COUNTS:  YES  TOURNIQUET:  * No tourniquets in log *  DICTATION: .Dragon Dictation  Indication for procedure: Patient is a 37 year old female who underwent a C-section in 2018.  Subsequent to this patient had a infection at the area of her incision site.  Thereafter she states that she noticed the bulge in this hernia.  Patient was seen in consultation on CT scan and was diagnosed with a incisional hernia from previous C-section.  Details of procedure: After the patient was consented she was taken back to the operating room and placed in the supine position with bilateral SCDs in place.  Patient prepped and draped in standard fashion.  After appropriate antibiotics were confirmed a timeout was called and all the facts were verified.    At this time a midline infraumbilical incision was made using a #10 blade.  Cautery was used to maintain hemostasis dissection was taken down the midline fascia.  This was incised.  This was elevated between 2 Kocher clamps.  The fascia was then incised sharply.  This appeared to be the midline portion of the hernia sac.  There were no underlying viscera.  At this time the lower midline incision was extended inferiorly under direct visualization.  There were a minimal amount of omental adhesions to the hernia sac inferiorly.   These were taken down with cautery to maintain hemostasis.  Once these were completely taken down the entire hernia can be visualized.  There is a large pocket the right lower quadrant.  At this time the incision was extended superiorly over the umbilicus to help with incision of the posterior rectus sheath.  The rectus muscles were visualized.    The rectus fascia seem to be attenuated.  The rectus muscles were grasped with Coker clamps.  These were retracted medially and the medial rectus fascia was incised with cautery.  At this time the posterior rectus sheath within dissected away from the rectus muscle.  This was done both superiorly and inferiorly and bilaterally.  Inferiorly the transversalis fascia was encountered.  This led [place] into the preperitoneal space to Cooper's ligament.  This was dissected down on both sides.  Superiorly there was some rectus diastases.  The linea alba was incised superiorly to bring both the left and right posterior rectus sheath together.  We proceeded superiorly approximately 4-6 cm.  At this time the posterior rectus fascia was then reapproximated using a standard running fashion and a 2-0 PDS x2.  This reapproximated the posterior fascia and close the abdominal cavity.  At this time the posterior rectus and hernia sacs were then irrigated out with sterile saline.  At this time the area was noted to be approximately 30 x 12 cm.  A piece of ultra pro was cut in half and brought into the field.  This was secured to Cooper's ligament using 0 Novafils  x2 inferiorly.  This splayed out into the retrorectus sheath appropriately.  This was repaired using an Endo Close device and transvaginal sutures using oh no refills x3 laterally, bilaterally and 2 superiorly.  Initially thought.  At this time fibrin glue was used to help secure the mesh to the posterior rectus sheath.  At this time the anterior rectus sheath was brought together in the midline using #1 PDS, single-stranded  in a standard running fashion x2.  20 cc of Exparel was then used to help infiltrate the rectus sheath and the rectus muscle bilaterally.  At this time a stab incision was made in the right lower quadrant to place the drain into the right lower quadrant hernia pocket as this was a large space.  This was a #10 [place] drain.  This was secured to the abdominal wall using 3-0 nylon.  A subcutaneous flap was created over the left lateral rectus muscle to help reapproximate the skin after abdominoplasty.  At this time the lower inferior portion of the abdominal skin was excised in elliptical fashion.  This was done to relieve the patient at the skin.  At this time 3-0 Vicryl interrupted stitches were then used to reapproximate the subcutaneous layer.  The midline was brought together in the lower abdominal wall skin was reapproximated in a lateral fashion creating a inverse T.  At this time the skin was then reapproximated using skin staples.  The lateral stab wounds for the trans-fascial sutures were dressed using Dermabond.  Honeycomb dressing was placed over the main incisions.  The bulb was then placed onto the drain in with airtight.  Patient tolerated procedure well was taken to the recovery in stable condition.  PLAN OF CARE: Admit for overnight observation  PATIENT DISPOSITION:  PACU - hemodynamically stable.   Delay start of Pharmacological VTE agent (>24hrs) due to surgical blood loss or risk of bleeding: no

## 2017-07-21 NOTE — Anesthesia Procedure Notes (Signed)
Procedure Name: Intubation Date/Time: 07/21/2017 11:20 AM Performed by: Candis Shine, CRNA Pre-anesthesia Checklist: Patient identified, Emergency Drugs available, Suction available and Patient being monitored Patient Re-evaluated:Patient Re-evaluated prior to induction Oxygen Delivery Method: Circle System Utilized Preoxygenation: Pre-oxygenation with 100% oxygen Induction Type: IV induction, Rapid sequence and Cricoid Pressure applied Laryngoscope Size: Mac and 3 Grade View: Grade I Tube type: Oral Tube size: 7.0 mm Number of attempts: 1 Airway Equipment and Method: Stylet Placement Confirmation: ETT inserted through vocal cords under direct vision,  positive ETCO2 and breath sounds checked- equal and bilateral Secured at: 22 cm Tube secured with: Tape Dental Injury: Teeth and Oropharynx as per pre-operative assessment

## 2017-07-22 LAB — CBC
HCT: 34.2 % — ABNORMAL LOW (ref 36.0–46.0)
Hemoglobin: 10.8 g/dL — ABNORMAL LOW (ref 12.0–15.0)
MCH: 29.5 pg (ref 26.0–34.0)
MCHC: 31.6 g/dL (ref 30.0–36.0)
MCV: 93.4 fL (ref 78.0–100.0)
PLATELETS: 366 10*3/uL (ref 150–400)
RBC: 3.66 MIL/uL — AB (ref 3.87–5.11)
RDW: 14.4 % (ref 11.5–15.5)
WBC: 15.4 10*3/uL — ABNORMAL HIGH (ref 4.0–10.5)

## 2017-07-22 LAB — BASIC METABOLIC PANEL
Anion gap: 7 (ref 5–15)
BUN: 12 mg/dL (ref 6–20)
CHLORIDE: 102 mmol/L (ref 101–111)
CO2: 28 mmol/L (ref 22–32)
Calcium: 8.2 mg/dL — ABNORMAL LOW (ref 8.9–10.3)
Creatinine, Ser: 0.87 mg/dL (ref 0.44–1.00)
GFR calc non Af Amer: >60 mL/min (ref >60)
Glucose, Bld: 113 mg/dL — ABNORMAL HIGH (ref 65–99)
POTASSIUM: 4.4 mmol/L (ref 3.5–5.1)
SODIUM: 137 mmol/L (ref 135–145)

## 2017-07-22 MED ORDER — ESCITALOPRAM OXALATE 20 MG PO TABS
20.0000 mg | ORAL_TABLET | Freq: Every day | ORAL | Status: DC
  Administered 2017-07-22 – 2017-07-23 (×2): 20 mg via ORAL
  Filled 2017-07-22 (×2): qty 1

## 2017-07-22 MED ORDER — ACETAMINOPHEN 325 MG PO TABS
650.0000 mg | ORAL_TABLET | Freq: Four times a day (QID) | ORAL | Status: DC | PRN
  Filled 2017-07-22: qty 2

## 2017-07-22 MED ORDER — KETOROLAC TROMETHAMINE 30 MG/ML IJ SOLN
30.0000 mg | Freq: Four times a day (QID) | INTRAMUSCULAR | Status: DC | PRN
  Administered 2017-07-22 (×2): 30 mg via INTRAVENOUS
  Filled 2017-07-22 (×2): qty 1

## 2017-07-22 MED ORDER — ACETAMINOPHEN 325 MG PO TABS
650.0000 mg | ORAL_TABLET | Freq: Four times a day (QID) | ORAL | Status: DC | PRN
  Administered 2017-07-22 – 2017-07-23 (×3): 650 mg via ORAL
  Filled 2017-07-22 (×2): qty 2

## 2017-07-22 MED ORDER — LABETALOL HCL 100 MG PO TABS
100.0000 mg | ORAL_TABLET | Freq: Every day | ORAL | Status: DC
  Administered 2017-07-22 – 2017-07-23 (×2): 100 mg via ORAL
  Filled 2017-07-22 (×2): qty 1

## 2017-07-22 MED ORDER — TERBINAFINE HCL 250 MG PO TABS
250.0000 mg | ORAL_TABLET | Freq: Every day | ORAL | Status: DC
  Administered 2017-07-22 – 2017-07-23 (×2): 250 mg via ORAL
  Filled 2017-07-22 (×2): qty 1

## 2017-07-22 NOTE — Progress Notes (Signed)
1 Day Post-Op   Subjective/Chief Complaint: Doing well this AM Ambulated yesterday. Pain well controlled tol PO well   Objective: Vital signs in last 24 hours: Temp:  [97 F (36.1 C)-98.5 F (36.9 C)] 97.8 F (36.6 C) (04/27 0210) Pulse Rate:  [54-79] 54 (04/27 0210) Resp:  [9-20] 17 (04/27 0210) BP: (114-178)/(77-111) 114/77 (04/27 0210) SpO2:  [93 %-99 %] 96 % (04/27 0210) Weight:  [82.6 kg (182 lb)-83.5 kg (184 lb)] 82.6 kg (182 lb) (04/26 0945) Last BM Date: 07/20/17  Intake/Output from previous day: 04/26 0701 - 04/27 0700 In: 2666.7 [P.O.:230; I.V.:2436.7] Out: 1290 [Urine:950; Drains:240; Blood:100] Intake/Output this shift: Total I/O In: 1266.7 [P.O.:230; I.V.:1036.7] Out: 390 [Urine:250; Drains:140]  General appearance: alert and cooperative GI: soft, non-tender; bowel sounds normal; no masses,  no organomegaly and inc c/d/i  Lab Results:  Recent Labs    07/22/17 0453  WBC 15.4*  HGB 10.8*  HCT 34.2*  PLT 366   BMET Recent Labs    07/22/17 0453  NA 137  K 4.4  CL 102  CO2 28  GLUCOSE 113*  BUN 12  CREATININE 0.87  CALCIUM 8.2*   PT/INR No results for input(s): LABPROT, INR in the last 72 hours. ABG No results for input(s): PHART, HCO3 in the last 72 hours.  Invalid input(s): PCO2, PO2  Studies/Results: No results found.  Anti-infectives: Anti-infectives (From admission, onward)   Start     Dose/Rate Route Frequency Ordered Stop   07/21/17 0922  ceFAZolin (ANCEF) IVPB 2g/100 mL premix     2 g 200 mL/hr over 30 Minutes Intravenous On call to O.R. 07/21/17 0922 07/21/17 1125      Assessment/Plan: s/p Procedure(s): EXPLORATORY LAPAROTOMY ERAS PATHWAY (N/A) INCISIONAL HERNIA REPAIR (N/A) INSERTION OF MESH (N/A) Advance diet as tol Mobilize If con't to do well home likely Sunday  LOS: 0 days    Rosario Jacks., Anne Hahn 07/22/2017

## 2017-07-22 NOTE — Progress Notes (Signed)
Continuous bleeding on the JP drain site noted, pressure dressing and ice pack applied. Notified MD, Lyman Bishop. Stated to reinforce dressing. Will monitor pt

## 2017-07-22 NOTE — Plan of Care (Signed)
  Problem: Clinical Measurements: Goal: Will remain free from infection Outcome: Progressing   Problem: Nutrition: Goal: Adequate nutrition will be maintained Outcome: Progressing   

## 2017-07-23 DIAGNOSIS — Z8249 Family history of ischemic heart disease and other diseases of the circulatory system: Secondary | ICD-10-CM | POA: Diagnosis not present

## 2017-07-23 DIAGNOSIS — Z841 Family history of disorders of kidney and ureter: Secondary | ICD-10-CM | POA: Diagnosis not present

## 2017-07-23 DIAGNOSIS — Z87891 Personal history of nicotine dependence: Secondary | ICD-10-CM | POA: Diagnosis not present

## 2017-07-23 DIAGNOSIS — F419 Anxiety disorder, unspecified: Secondary | ICD-10-CM | POA: Diagnosis present

## 2017-07-23 DIAGNOSIS — F329 Major depressive disorder, single episode, unspecified: Secondary | ICD-10-CM | POA: Diagnosis present

## 2017-07-23 DIAGNOSIS — K432 Incisional hernia without obstruction or gangrene: Secondary | ICD-10-CM | POA: Diagnosis present

## 2017-07-23 DIAGNOSIS — I10 Essential (primary) hypertension: Secondary | ICD-10-CM | POA: Diagnosis present

## 2017-07-23 DIAGNOSIS — Z98891 History of uterine scar from previous surgery: Secondary | ICD-10-CM | POA: Diagnosis not present

## 2017-07-23 DIAGNOSIS — Z8261 Family history of arthritis: Secondary | ICD-10-CM | POA: Diagnosis not present

## 2017-07-23 DIAGNOSIS — Z833 Family history of diabetes mellitus: Secondary | ICD-10-CM | POA: Diagnosis not present

## 2017-07-23 MED ORDER — TRAMADOL HCL 50 MG PO TABS: 50.0000 mg | ORAL_TABLET | Freq: Four times a day (QID) | ORAL | 0 refills | Status: DC | PRN

## 2017-07-23 NOTE — Plan of Care (Signed)

## 2017-07-23 NOTE — Discharge Instructions (Signed)
[facility] _______[facility] ° °HERNIA REPAIR: POST OP INSTRUCTIONS ° °Always review your discharge instruction sheet given to you by the facility where your surgery was performed. °IF YOU HAVE DISABILITY OR FAMILY LEAVE FORMS, YOU MUST BRING THEM TO THE OFFICE FOR PROCESSING.   °DO NOT GIVE THEM TO YOUR DOCTOR. ° °1. A  prescription for pain medication may be given to you upon discharge.  Take your pain medication as prescribed, if needed.  If narcotic pain medicine is not needed, then you may take acetaminophen (Tylenol) or ibuprofen (Advil) as needed. °2. Take your usually prescribed medications unless otherwise directed. °If you need a refill on your pain medication, please contact your pharmacy.  They will contact our office to request authorization. Prescriptions will not be filled after 5 pm or on week-ends. °3. You should follow a light diet the first 24 hours after arrival home, such as soup and crackers, etc.  Be sure to include lots of fluids daily.  Resume your normal diet the day after surgery. °4.Most patients will experience some swelling and bruising around the umbilicus or in the groin and scrotum.  Ice packs and reclining will help.  Swelling and bruising can take several days to resolve.  °6. It is common to experience some constipation if taking pain medication after surgery.  Increasing fluid intake and taking a stool softener (such as Colace) will usually help or prevent this problem from occurring.  A mild laxative (Milk of Magnesia or Miralax) should be taken according to package directions if there are no bowel movements after 48 hours. °7. Unless discharge instructions indicate otherwise, you may remove your bandages 24-48 hours after surgery, and you may shower at that time.  You may have steri-strips (small skin tapes) in place directly over the incision.  These strips should be left on the skin for 7-10 days.  If your surgeon used skin glue on the incision, you may shower in  24 hours.  The glue will flake off over the next 2-3 weeks.  Any sutures or staples will be removed at the office during your follow-up visit. °8. ACTIVITIES:  You may resume regular (light) daily activities beginning the next day--such as daily self-care, walking, climbing stairs--gradually increasing activities as tolerated.  You may have sexual intercourse when it is comfortable.  Refrain from any heavy lifting or straining until approved by your doctor. ° °a.You may drive when you are no longer taking prescription pain medication, you can comfortably wear a seatbelt, and you can safely maneuver your car and apply brakes. °b.RETURN TO WORK:   °_____________________________________________ ° °9.You should see your doctor in the office for a follow-up appointment approximately 2-3 weeks after your surgery.  Make sure that you call for this appointment within a day or two after you arrive home to insure a convenient appointment time. °10.OTHER INSTRUCTIONS: _________________________ °   _____________________________________ ° °WHEN TO CALL YOUR DOCTOR: °1. Fever over 101.0 °2. Inability to urinate °3. Nausea and/or vomiting °4. Extreme swelling or bruising °5. Continued bleeding from incision. °6. Increased pain, redness, or drainage from the incision ° °The clinic staff is available to answer your questions during regular business hours.  Please don’t hesitate to call and ask to speak to one of the nurses for clinical concerns.  If you have a medical emergency, go to the nearest emergency room or call 911.  A surgeon from [facility] is always on call at the hospital ° ° °[address], [address], [place], [place]  [address] ? ° [address] [address], [place], [place]   [address] °[phone] ? [phone] ? FAX [phone] °Web site: [email or web address] ° °

## 2017-07-23 NOTE — Progress Notes (Signed)
Discharge home. Home discharge instruction given, no question verbalized. 

## 2017-07-23 NOTE — Discharge Summary (Signed)
Physician Discharge Summary  Patient ID: Loretta Brown MRN: [id number] DOB/AGE: Aug 02, 1980 37 y.o.  Admit date: 07/21/2017 Discharge date: 07/23/2017  Admission Diagnoses: Incisional  Discharge Diagnoses:  Active Problems:   S/P hernia repair   Discharged Condition: good  Hospital Course: Patient is a 37 year old female who was seen with a large laparotomy, mesh.  Postoperative patient was sent for.  She started on a clear liquid diet and advance to regular diet.  Is tolerating this well on discharge.  Patient was otherwise ambulating well at home.  She pain control.  Patient  Patient was deemed stable for discharge discharge home.  Consults: none  Significant Diagnostic Studies: none  Treatments: surgery as above  Discharge Exam: Blood pressure (!) 147/87, pulse 61, temperature 98.3 F (36.8 C), temperature source Oral, resp. rate 19, height 5\' 3"  (1.6 m), weight 82.6 kg (182 lb), SpO2 95 %, not currently breastfeeding. General appearance: alert and cooperative Incision/Wound: inc c/d/i, Jp SS   Disposition: Discharge disposition: 01-Home or Self Care       Discharge Instructions    Diet - low sodium heart healthy   Complete by:  As directed    Increase activity slowly   Complete by:  As directed       No current facility-administered medications on file prior to encounter.    Current Outpatient Medications on File Prior to Encounter  Medication Sig Dispense Refill  . amphetamine-dextroamphetamine (ADDERALL) 30 MG tablet Take 30 mg by mouth daily as needed (focus).     Marland Kitchen escitalopram (LEXAPRO) 20 MG tablet Take 20 mg by mouth daily.    Marland Kitchen labetalol (NORMODYNE) 100 MG tablet Take 100 mg by mouth daily.     Marland Kitchen levonorgestrel (MIRENA) 20 MCG/24HR IUD 1 each by Intrauterine route once.    . terbinafine (LAMISIL) 250 MG tablet Take 250 mg by mouth daily.  0  . butalbital-acetaminophen-caffeine (FIORICET, ESGIC) 50-325-40 MG tablet Take 1 tablet by mouth every 6 (six)  hours as needed for headache. (Patient not taking: Reported on 07/13/2017) 14 tablet 0  . ibuprofen (ADVIL,MOTRIN) 600 MG tablet Take 1 tablet (600 mg total) by mouth every 6 (six) hours. (Patient not taking: Reported on 07/13/2017) 30 tablet 1  . labetalol (NORMODYNE) 300 MG tablet Take 1 tablet (300 mg total) by mouth 2 (two) times daily. (Patient not taking: Reported on 07/13/2017) 60 tablet 3  . NIFEdipine (PROCARDIA-XL/ADALAT CC) 30 MG 24 hr tablet Take 1 tablet (30 mg total) by mouth daily. (Patient not taking: Reported on 07/13/2017) 30 tablet 3  . oxyCODONE (OXY IR/ROXICODONE) 5 MG immediate release tablet Take 1 tablet (5 mg total) by mouth every 4 (four) hours as needed (pain scale 4-7). (Patient not taking: Reported on 07/13/2017) 30 tablet 0      Follow-up Information    Ralene Ok, MD. Schedule an appointment as soon as possible for a visit in 1 week(s).   Specialty:  General Surgery Contact information: [address] [place] [address] [phone]           Signed: Rosario Jacks., Anne Hahn 07/23/2017, 8:02 AM

## 2017-07-25 ENCOUNTER — Encounter (HOSPITAL_COMMUNITY): Payer: Self-pay | Admitting: General Surgery

## 2018-03-05 ENCOUNTER — Telehealth: Payer: Self-pay | Admitting: Hematology

## 2018-03-05 ENCOUNTER — Encounter: Payer: Self-pay | Admitting: Hematology

## 2018-03-05 NOTE — Telephone Encounter (Signed)
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 Pt has been scheduled to see Dr. Irene Limbo on 03/29/18 at 10am. Pt agreed to the appt date and time. Letter mailed.

## 2018-03-27 NOTE — Progress Notes (Signed)
HEMATOLOGY/ONCOLOGY CONSULTATION NOTE  Date of Service: 03/29/2018  Patient Care Team: Maury Dus, MD as PCP - General (Family Medicine)  CHIEF COMPLAINTS/PURPOSE OF CONSULTATION:  Thrombocytosis  HISTORY OF PRESENTING ILLNESS:   Loretta Brown is a wonderful 37 y.o. female who has been referred to [place] by Dr. Maury Dus for evaluation and management of thrombocytosis. She is accompanied today by her husband. The pt reports that she is doing well overall.   Of note prior to the patient's arrival, the pt had an exploratory laparotomy for an incisional hernia repair on 07/21/17 with Dr. Rosendo Gros, and had a cesarean section on 12/01/16 with Dr. Nelda Marseille. 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  The pt reports that she is currently having a cold and has a weakened/hoarsened voice. She notes that she first heard of elevated platelets only recently. She does note that she was anemic when she was little. She is now on Mirena and endorses light periods every couple months.   The pt notes that she had pre-eclampsia after her cesarean section, and that there were concerns with sleep apnea during pregnancy. She has not had a sleep study, and has lost 40 pounds since her pregnancy, and notes that her husband believes she is snoring much less. The pt notes that she is currently smoking 4-5 cigarettes each day, which she picked back up in the last several months. She reports 1-2 glasses of wine each day, and other social consumption of alcohol.   She has been taking Adderall since she was 37 years old. She notes that she quit taking Adderall during her last pregnancy and for a year after that, only recently resuming this medication in September 2019. She is now taking 30mg  Adderall BID. The pt changed from Lexapro to Cymbalta in December 2019, and continues on Buproprion. The pt notes that these medications have been very helpful for her emotional stability. The  pt also notes that she began having night sweats when she began Cymbalta in the past, and when she restarted this medication recently.  She denies ever having a blood clot and denies a family history of early heart attacks of strokes. The pt reports a family history in the men in her family requiring kidney transplants due to untreated high blood pressure.   Most recent lab results (02/04/18) of CBC w/diff and CMP is as follows: all values are WNL except for PLT at 510k, MPV at 7.3, Albumin at 4.9.  On review of systems, pt reports current cold, periods every couple months, intentional weight loss, night sweats, and denies leg swelling, lower abdominal pains, fevers, and any other symptoms.   On PMHx the pt reports Cesarean section in September 2018, Incisional hernia repair in April 2019, and denies any blood clots. On Social Hx the pt reports smoking 4-5 cigarettes each day, and consumes 1-2 glasses of wine each day.  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   MEDICAL HISTORY:  Past Medical History:  Diagnosis Date  . Attention deficit disorder   . Depression   . Hypertension     SURGICAL HISTORY: Past Surgical History:  Procedure Laterality Date  . CESAREAN SECTION N/A 12/01/2016   Procedure: CESAREAN SECTION;  Surgeon: Janyth Pupa, DO;  Location: [facility];  Service: Obstetrics;  Laterality: N/A;  . INCISIONAL HERNIA REPAIR N/A 07/21/2017  Procedure: INCISIONAL HERNIA REPAIR;  Surgeon: Ralene Ok, MD;  Location: [facility];  Service: General;  Laterality: N/A;  . INSERTION OF MESH N/A 07/21/2017   Procedure: INSERTION OF MESH;  Surgeon: Ralene Ok, MD;  Location: [facility];  Service: General;  Laterality: N/A;  . LAPAROSCOPY N/A 06/23/2015   Procedure: LAPAROSCOPY DIAGNOSTIC, Evacuation Hemoperitoneum;  Surgeon: Janyth Pupa, DO;  Location: [facility];  Service: Gynecology;  Laterality: N/A;  . LAPAROTOMY N/A 07/21/2017   Procedure: [place];  Surgeon: Ralene Ok, MD;  Location: [facility];  Service: General;  Laterality: N/A;  . NO PAST SURGERIES    . UNILATERAL SALPINGECTOMY Left 06/23/2015   Procedure: Left  SALPINGECTOMY with Removal Ectopic Pregnancy;  Surgeon: Janyth Pupa, DO;  Location: [facility];  Service: Gynecology;  Laterality: Left;    SOCIAL HISTORY: Social History   Socioeconomic History  . Marital status: Married    Spouse name: Not on file  . Number of children: Not on file  . Years of education: Not on file  . Highest education level: Not on file  Occupational History  . Not on file  Social Needs  . Financial resource strain: Not on file  . Food insecurity:    Worry: Not on file    Inability: Not on file  . Transportation needs:    Medical: Not on file    Non-medical: Not on file  Tobacco Use  . Smoking status: Former Smoker    Last attempt to quit: 04/14/2016    Years since quitting: 1.9  . Smokeless tobacco: Never Used  Substance and Sexual Activity  . Alcohol use: Yes    Alcohol/week: 7.0 standard drinks    Types: 7 Glasses of wine per week  . Drug use: Yes    Types: Marijuana    Comment: last time 07/05/17-will not   . Sexual activity: Yes    Birth control/protection: I.U.D.  Lifestyle  . Physical activity:    Days per week: Not on file    Minutes per session: Not on file  . Stress: Not on file  Relationships  . Social connections:    Talks on phone: Not on file    Gets together: Not on file    Attends religious service: Not on file    Active member of club or organization: Not on file    Attends meetings of clubs or organizations: Not on file    Relationship status: Not on file  . Intimate partner violence:    Fear of current or ex partner: Not on file    Emotionally abused: Not on file    Physically abused: Not on file    Forced sexual activity: Not on file   Other Topics Concern  . Not on file  Social History Narrative  . Not on file    FAMILY HISTORY: Family History  Problem Relation Age of Onset  . Hypertension Mother   . Hypertension Father   . Diabetes Paternal Grandfather     ALLERGIES:  is allergic to hydrocodone.  MEDICATIONS:  Current Outpatient Medications  Medication Sig Dispense Refill  . amphetamine-dextroamphetamine (ADDERALL) 30 MG tablet Take 30 mg by mouth daily as needed (focus).     Marland Kitchen buPROPion (WELLBUTRIN SR) 150 MG 12 hr tablet Take 150 mg by mouth daily.    . DULoxetine (CYMBALTA) 30 MG capsule Take 30 mg by mouth 2 (two) times daily.    . irbesartan-hydrochlorothiazide (AVALIDE) 150-12.5 MG tablet Take 1 tablet by  mouth daily.    Marland Kitchen levonorgestrel (MIRENA) 20 MCG/24HR IUD 1 each by Intrauterine route once.    . escitalopram (LEXAPRO) 20 MG tablet Take 20 mg by mouth daily.    Marland Kitchen labetalol (NORMODYNE) 100 MG tablet Take 100 mg by mouth daily.     Marland Kitchen terbinafine (LAMISIL) 250 MG tablet Take 250 mg by mouth daily.  0  . traMADol (ULTRAM) 50 MG tablet Take 1 tablet (50 mg total) by mouth every 6 (six) hours as needed (mild pain). (Patient not taking: Reported on 03/29/2018) 30 tablet 0   No current facility-administered medications for this visit.     REVIEW OF SYSTEMS:    A 10+ POINT REVIEW OF SYSTEMS WAS OBTAINED including neurology, dermatology, psychiatry, cardiac, respiratory, lymph, extremities, GI, GU, Musculoskeletal, constitutional, breasts, reproductive, HEENT.  All pertinent positives are noted in the HPI.  All others are negative.   PHYSICAL EXAMINATION: . Vitals:   03/29/18 1103  BP: (!) 153/108  Pulse: 92  Resp: 18  Temp: 98.4 F (36.9 C)  SpO2: 100%   Filed Weights   03/29/18 1103  Weight: 182 lb 1.6 oz (82.6 kg)   .Body mass index is 32.26 kg/m.  GENERAL:alert, in no acute distress and comfortable SKIN: no acute rashes, no significant lesions EYES: conjunctiva are pink and  non-injected, sclera anicteric OROPHARYNX: MMM, no exudates, no oropharyngeal erythema or ulceration NECK: supple, no JVD LYMPH:  no palpable lymphadenopathy in the cervical, axillary or inguinal regions LUNGS: clear to auscultation b/l with normal respiratory effort HEART: regular rate & rhythm ABDOMEN:  normoactive bowel sounds , non tender, not distended. No palpable hepatosplenomegaly.  Extremity: no pedal edema PSYCH: alert & oriented x 3 with fluent speech NEURO: no focal motor/sensory deficits  LABORATORY DATA:  I have reviewed the data as listed  . CBC Latest Ref Rng & Units 07/22/2017 07/13/2017 12/04/2016  WBC 4.0 - 10.5 K/uL 15.4(H) 10.3 17.8(H)  Hemoglobin 12.0 - 15.0 g/dL 10.8(L) 14.0 8.5(L)  Hematocrit 36.0 - 46.0 % 34.2(L) 41.9 25.8(L)  Platelets 150 - 400 K/uL 366 460(H) 398    . CMP Latest Ref Rng & Units 07/22/2017 07/13/2017 12/04/2016  Glucose 65 - 99 mg/dL 113(H) 86 124(H)  BUN 6 - 20 mg/dL 12 9 20   Creatinine 0.44 - 1.00 mg/dL 0.87 0.85 0.81  Sodium 135 - 145 mmol/L 137 139 140  Potassium 3.5 - 5.1 mmol/L 4.4 4.0 3.6  Chloride 101 - 111 mmol/L 102 106 105  CO2 22 - 32 mmol/L 28 23 24   Calcium 8.9 - 10.3 mg/dL 8.2(L) 9.0 8.5(L)  Total Protein 6.5 - 8.1 g/dL - 7.2 5.8(L)  Total Bilirubin 0.3 - 1.2 mg/dL - 0.5 0.6  Alkaline Phos 38 - 126 U/L - 148(H) 87  AST 15 - 41 U/L - 23 31  ALT 14 - 54 U/L - 17 16   02/04/18 CBC w/diff and CMP:    RADIOGRAPHIC STUDIES: I have personally reviewed the radiological images as listed and agreed with the findings in the report. No results found.  ASSESSMENT & PLAN:   37 y.o. female with  1. Thrombocytosis PLAN -Discussed patient's most recent labs from 02/04/18, PLT at [place], HGB normal, MCV normal, WBC normal -Reviewed patient's previous labs: CBC from 07/22/17 revealed PLT at 366k, CBC from 07/13/17 revealed PLT at 460k, CBC from 12/04/16 revealed PLT at 398k -Discussed that the patient's pattern of fluctuaing  thrombocytosis and normal platelet values corresponds to the times when she was on  and off Adderall, respectively. Suspect reactive process due to medication effect of Adderall. -there is a low index of suspicion for a primary bone marrow problem such as essential thrombocytosis. -Pt also smokes cigarettes and this could also potentially cause thrombocytosis, and did counsel her towards complete smoking cessation which she is responsive towards. -Recommend PCP evaluate for iron deficiency, replacing if necessary for a Ferritin greater than 50 and an Iron Saturation more than 20%; using the lowest effective dose of Adderall, and continuing to counsel pt towards complete smoking cessation. -The patient prefers not to pursue lab testing to rule out essential thrombocytosis at this time, noting financial concerns for increased testing. If pt prefers testing in the future, PCP could choose to test for JAK2 [id number] mutation, Calreticulin mutation, and MPL mutations to r/o clonal thrombocytosis or send her back to our clinic if patient so wishes.  -Will be happy to see this pt back as needed  RTC with Dr Irene Limbo as needed   All of the patients questions were answered with apparent satisfaction. The patient knows to call the clinic with any problems, questions or concerns.  The total time spent in the appt was 45 minutes and more than 50% was on counseling and direct patient cares.    Sullivan Lone MD MS AAHIVMS [facility] Hematology/Oncology Physician [facility]  (Office):       [phone] (Work cell):  [phone] (Fax):           [phone]  03/29/2018 12:27 PM  I, Baldwin Jamaica, am acting as a scribe for Dr. Sullivan Lone.   .I have reviewed the above documentation for accuracy and completeness, and I agree with the above. Brunetta Genera MD

## 2018-03-29 ENCOUNTER — Encounter: Payer: Self-pay | Admitting: Hematology

## 2018-03-29 ENCOUNTER — Inpatient Hospital Stay: Payer: BC Managed Care – PPO | Attending: Hematology | Admitting: Hematology

## 2018-03-29 VITALS — BP 153/108 | HR 92 | Temp 98.4°F | Resp 18 | Wt 182.1 lb

## 2018-03-29 DIAGNOSIS — D473 Essential (hemorrhagic) thrombocythemia: Secondary | ICD-10-CM | POA: Insufficient documentation

## 2018-03-29 DIAGNOSIS — Z87891 Personal history of nicotine dependence: Secondary | ICD-10-CM

## 2018-03-29 DIAGNOSIS — I10 Essential (primary) hypertension: Secondary | ICD-10-CM

## 2018-03-29 DIAGNOSIS — D75839 Thrombocytosis, unspecified: Secondary | ICD-10-CM

## 2018-03-29 NOTE — Patient Instructions (Signed)
Thank you for choosing [facility] to provide your oncology and hematology care.  To afford each patient quality time with our providers, please arrive 30 minutes before your scheduled appointment time.  If you arrive late for your appointment, you may be asked to reschedule.  We strive to give you quality time with our providers, and arriving late affects you and other patients whose appointments are after yours.    If you are a no show for multiple scheduled visits, you may be dismissed from the clinic at the providers discretion.     Again, thank you for choosing [facility], our hope is that these requests will decrease the amount of time that you wait before being seen by our physicians.  ______________________________________________________________________   Should you have questions after your visit to the [facility], please contact our office at [phone] between the hours of 8:30 and 4:30 p.m.    Voicemails left after 4:30p.m will not be returned until the following business day.     For prescription refill requests, please have your pharmacy contact us directly.  Please also try to allow 48 hours for prescription requests.     Please contact the scheduling department for questions regarding scheduling.  For scheduling of procedures such as PET scans, CT scans, MRI, Ultrasound, etc please contact central scheduling at [phone].     Resources For Cancer Patients and Caregivers:    Oncolink.org:  A wonderful resource for patients and healthcare providers for information regarding your disease, ways to tract your treatment, what to expect, etc.      American Cancer Society:  800-227-2345  Can help patients locate various types of support and financial assistance   Cancer Care: 1-800-813-HOPE (4673) Provides financial assistance, online support groups, medication/co-pay assistance.     [place] DSS:  336-641-3447 Where to apply  for food stamps, Medicaid, and utility assistance   Medicare Rights Center: 800-333-4114 Helps people with Medicare understand their rights and benefits, navigate the Medicare system, and secure the quality healthcare they deserve   SCAT: 336-333-6589 [place] [employer]'s shared-ride transportation service for eligible riders who have a disability that prevents them from riding the fixed route bus.     For additional information on assistance programs please contact our social worker:   Abigail Elmore:  [phone]  

## 2018-03-29 NOTE — Progress Notes (Signed)
Patient's spouse came in to inquire about lab charges. Advised him it would depend on the type of labs drawn and they would be sent out to a company to be processed which would be billed separately.  Advised all uninsured patients receive an automatic 55% discount for being uninsured for services billed through [facility] only. Asked if she ever applied for Medicaid. He states they have and were over income by a little. Gave him the [facility] FAA and list of documents to be submitted with it to determine if she is eligible for any more than the 55%. He verbalized understanding. Advised this would only apply to services billed through [facility] and that she can mail to address below or bring application and supporting documents here and I would fax over.   He took my card for any additional financial questions or concerns.

## 2018-03-30 ENCOUNTER — Telehealth: Payer: Self-pay

## 2018-03-30 NOTE — Telephone Encounter (Signed)
RTC with Dr Irene Limbo as needed. Per 1/2 los

## 2018-09-26 IMAGING — US US MFM OB DETAIL+14 WK
1 of 2 series · 13 of 28 positions shown · non-contrast
Comparison: none

[Series 1: us mfm ob detail+14 wk · 13 of 73 slices shown]
[im 3/73]
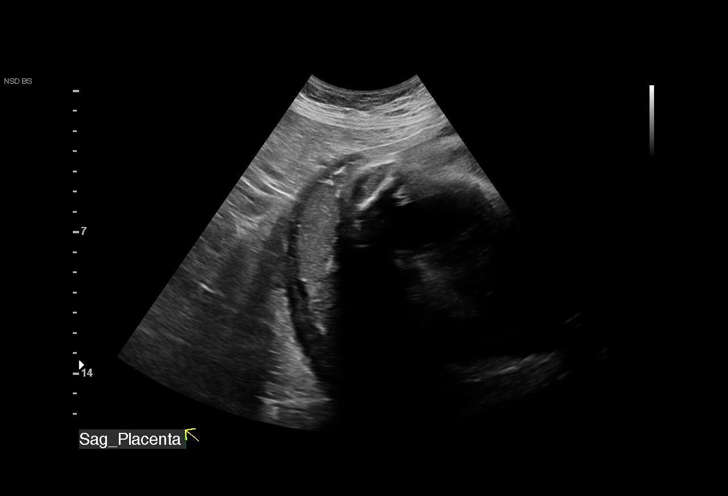
[im 9/73]
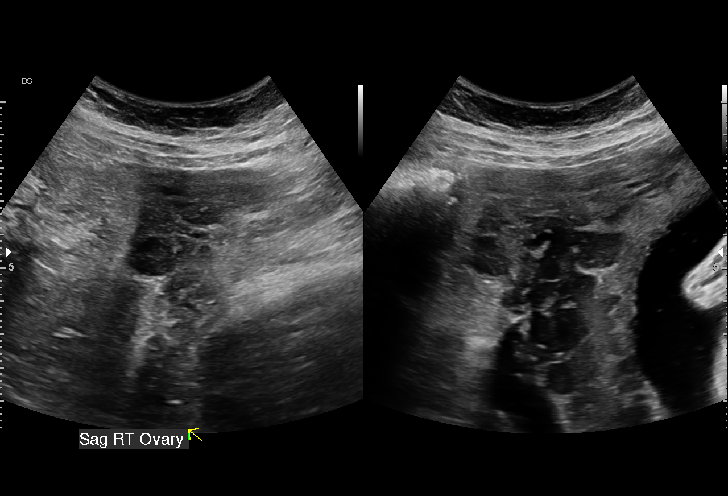
[im 14/73]
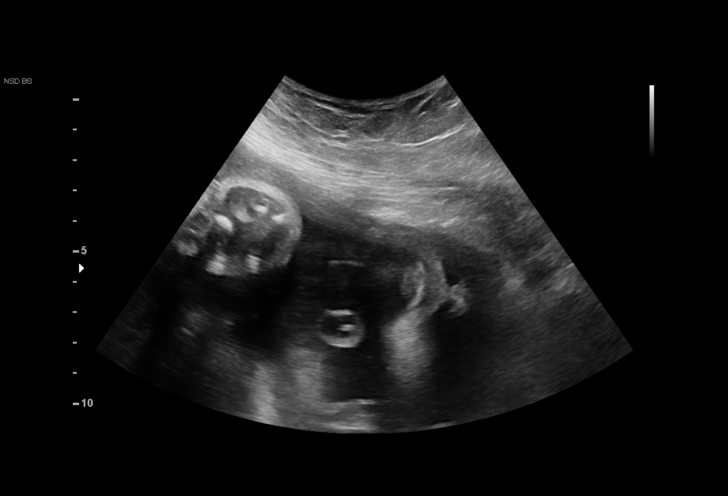
[im 20/73]
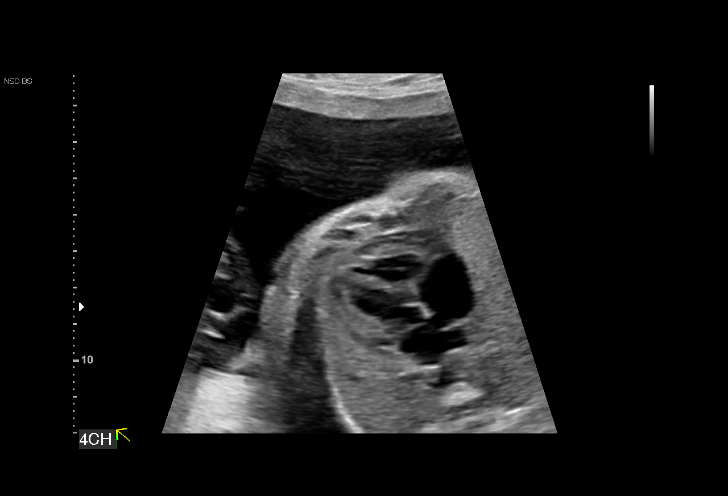
[im 25/73]
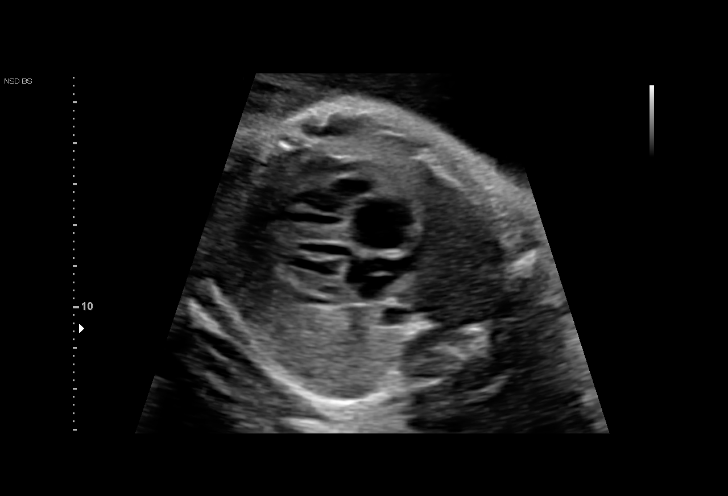
[im 31/73]
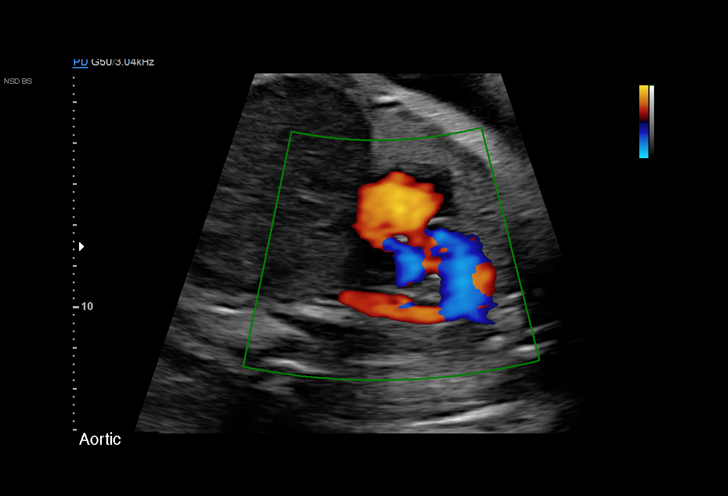
[im 39/73]
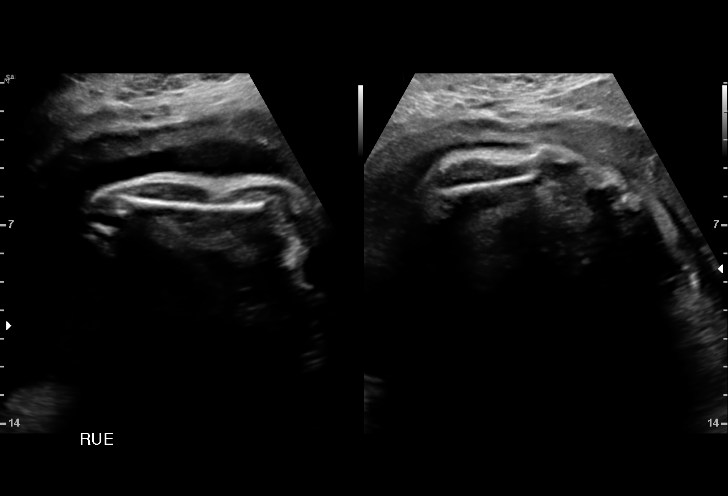
[im 45/73]
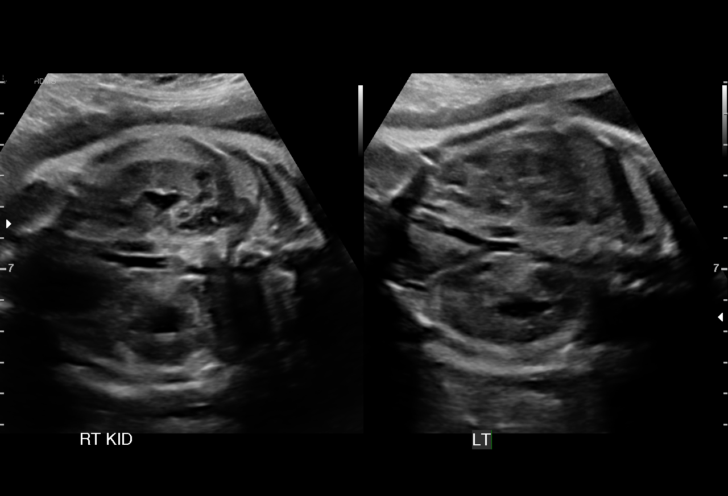
[im 50/73]
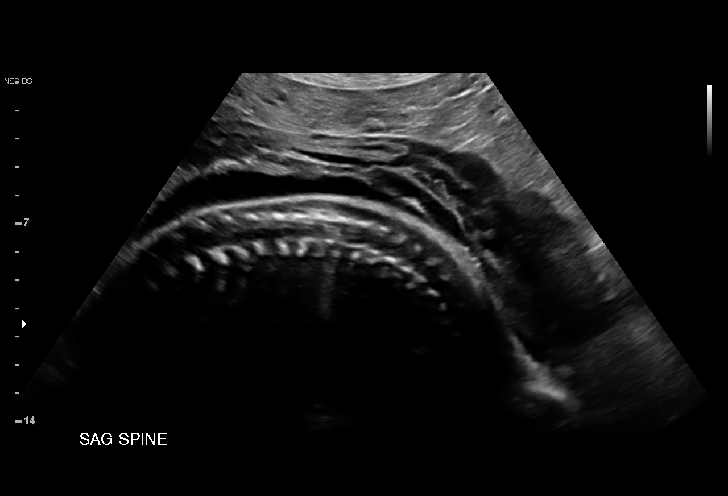
[im 56/73]
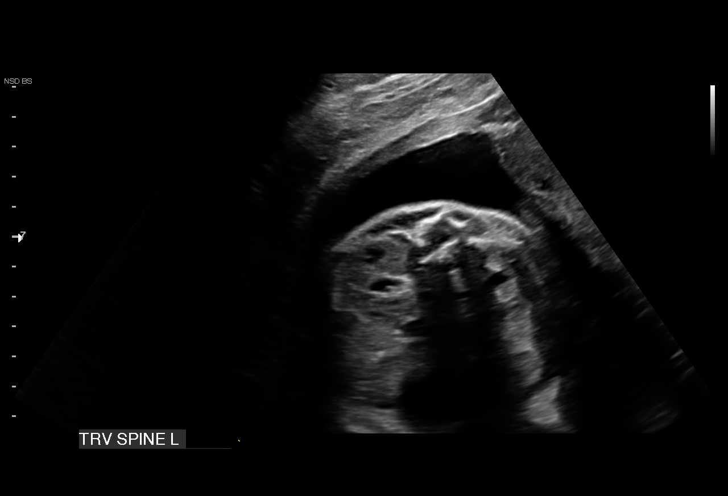
[im 61/73]
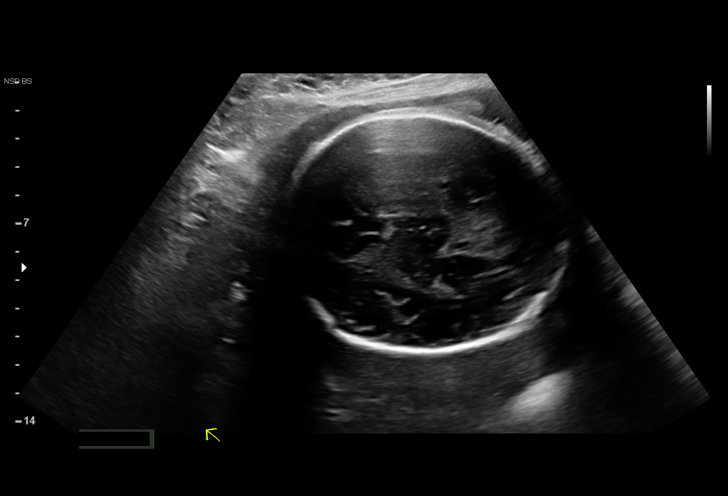
[im 67/73]
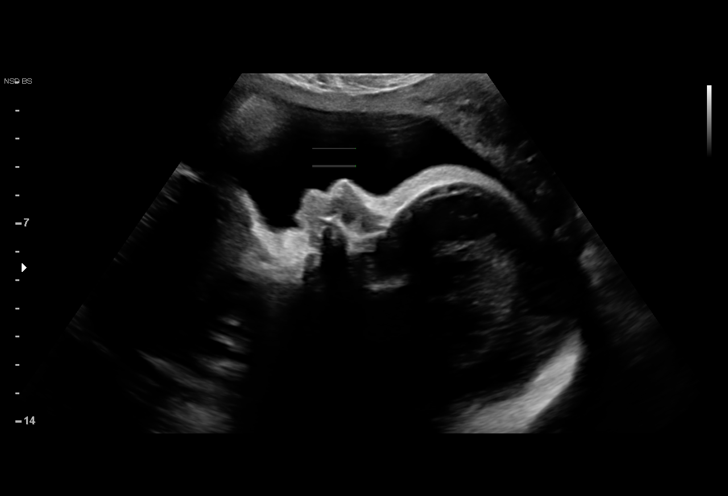
[im 73/73]
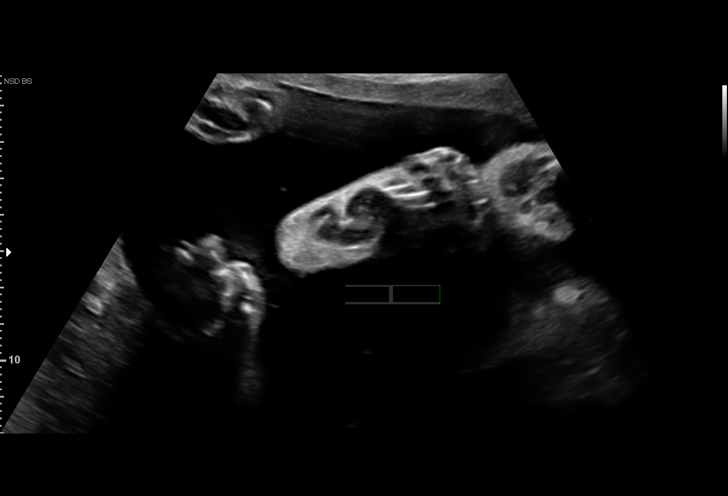

[13 of 28 positions shown; findings below may reference images not displayed]

Indications

30 weeks gestation of pregnancy
Advanced maternal age multigravida 35+,
third trimester (Declined Panorama)
Hypertension - Chronic/Pre-existing
Gestational diabetes in pregnancy, diet
controlled
Poor obstetric history: Previous preterm
delivery, antepartum
OB History

Gravidity:    4         Term:   0        Prem:   1        SAB:   0
TOP:          0       Ectopic:  1        Living: 1
Fetal Evaluation

Num Of Fetuses:     1
Fetal Heart         157
Rate(bpm):
Cardiac Activity:   Observed
Presentation:       Cephalic
Placenta:           Posterior, above cervical os
P. Cord Insertion:  Visualized

Amniotic Fluid
AFI FV:      Subjectively within normal limits

AFI Sum(cm)     %Tile       Largest Pocket(cm)
19.26           74
RUQ(cm)       RLQ(cm)       LUQ(cm)        LLQ(cm)
4.84
Biometry

BPD:      83.3  mm     G. Age:  33w 4d         99  %    CI:        76.56   %    70 - 86
FL/HC:      18.3   %    19.2 -
HC:      301.6  mm     G. Age:  33w 3d         92  %    HC/AC:      1.02        0.99 -
AC:      295.4  mm     G. Age:  33w 4d       > 97  %    FL/BPD:     66.4   %    71 - 87
FL:       55.3  mm     G. Age:  29w 1d         11  %    FL/AC:      18.7   %    20 - 24
HUM:      50.9  mm     G. Age:  29w 6d         42  %
CER:      40.4  mm     G. Age:  34w 3d       > 95  %

CM:        9.6  mm

Est. FW:    5572  gm      4 lb 4 oz     82  %
Gestational Age

LMP:           30w 2d        Date:  03/16/16                 EDD:   12/21/16
U/S Today:     32w 3d                                        EDD:   12/06/16
Best:          30w 2d     Det. By:  LMP  (03/16/16)          EDD:   12/21/16
Anatomy

Cranium:               Appears normal         LVOT:                   Appears normal
Cavum:                 Appears normal         Aortic Arch:            Appears normal
Ventricles:            Appears normal         Ductal Arch:            Appears normal
Choroid Plexus:        Appears normal         Diaphragm:              Appears normal
Cerebellum:            Appears normal         Stomach:                Appears normal, left
sided
Posterior Fossa:       Appears normal         Abdomen:                Appears normal
Nuchal Fold:           Not applicable (>20    Abdominal Wall:         Appears nml (cord
wks GA)                                        insert, abd wall)
Face:                  Orbits appear          Cord Vessels:           Appears normal (3
normal                                         vessel cord)
Lips:                  Appears normal         Kidneys:                Appear normal
Palate:                Not well visualized    Bladder:                Appears normal
Thoracic:              Appears normal         Spine:                  Limited views
appear normal
Heart:                 Appears normal         Upper Extremities:      Visualized
(4CH, axis, and situs
RVOT:                  Appears normal         Lower Extremities:      Visualized

Other:  Parents do not wish to know sex of fetus. Technically difficult due to
advanced GA and fetal position.
Cervix Uterus Adnexa

Cervix
Not visualized (advanced GA >22wks)

Left Ovary
Not visualized.

Right Ovary
Within normal limits.

Adnexa:       No abnormality visualized.
Impression

Single IUP at 30w 2d
Limited views of the spine and profile were obtained
The remainder of the fetal anatomy appears normal
The estimated fetal weight is at the 82nd %tile.  The AC
measures > 97th %tile.
Normal amniotic fluid volume
Posterior placenta without previa
Recommendations

See separate consult note
Ultrasound for growth in 4-6 weeks. Please contact our office
if you would prefer that this study be scheduled with MFM

## 2019-01-15 ENCOUNTER — Encounter: Payer: Self-pay | Admitting: Family Medicine

## 2019-01-15 ENCOUNTER — Ambulatory Visit (INDEPENDENT_AMBULATORY_CARE_PROVIDER_SITE_OTHER): Payer: Medicaid Other | Admitting: Family Medicine

## 2019-01-15 ENCOUNTER — Other Ambulatory Visit: Payer: Self-pay

## 2019-01-15 DIAGNOSIS — Z114 Encounter for screening for human immunodeficiency virus [HIV]: Secondary | ICD-10-CM | POA: Insufficient documentation

## 2019-01-15 DIAGNOSIS — I10 Essential (primary) hypertension: Secondary | ICD-10-CM

## 2019-01-15 DIAGNOSIS — F988 Other specified behavioral and emotional disorders with onset usually occurring in childhood and adolescence: Secondary | ICD-10-CM

## 2019-01-15 DIAGNOSIS — Z23 Encounter for immunization: Secondary | ICD-10-CM

## 2019-01-15 DIAGNOSIS — F418 Other specified anxiety disorders: Secondary | ICD-10-CM | POA: Diagnosis not present

## 2019-01-15 LAB — POCT GLYCOSYLATED HEMOGLOBIN (HGB A1C): Hemoglobin A1C: 5.2 % (ref 4.0–5.6)

## 2019-01-15 MED ORDER — AMPHETAMINE-DEXTROAMPHET ER 20 MG PO CP24: 20.0000 mg | ORAL_CAPSULE | Freq: Every day | ORAL | 0 refills | Status: DC

## 2019-01-15 MED ORDER — IRBESARTAN-HYDROCHLOROTHIAZIDE 150-12.5 MG PO TABS: 1.0000 | ORAL_TABLET | Freq: Every day | ORAL | 3 refills | Status: DC

## 2019-01-15 MED ORDER — DULOXETINE HCL 30 MG PO CPEP: 30.0000 mg | ORAL_CAPSULE | Freq: Two times a day (BID) | ORAL | 3 refills | Status: DC

## 2019-01-15 MED ORDER — BUPROPION HCL ER (SR) 150 MG PO TB12: 300.0000 mg | ORAL_TABLET | Freq: Every day | ORAL | 3 refills | Status: DC

## 2019-01-15 NOTE — Assessment & Plan Note (Addendum)
Patient to wean off Adderall as she is hypertensive and and reports anxiety as well.  Taking 30 mg twice daily and will wean today to 20 mg ER daily.  Reassess in 1 month follow-up.  We will try to avoid tablets.  Continue to wean as tolerated.

## 2019-01-15 NOTE — Patient Instructions (Signed)
It was great seeing you today! - Stop by the pharmacy to pick up your prescriptions - Schedule an appointment for PAP in 1-2 weeks.  - Schedule a follow up appointment for 4 weeks for medication management.   Please check-out at the front desk before leaving the clinic. I'd like to see you back 1-2 weeks for PAP smear but if you need to be seen earlier than that for any new issues we're happy to fit you in, just give [place] a call!  We are checking some labs today. If results require attention, either myself or my nurse will get in touch with you. If everything is normal, you will get a letter in the mail or a message in My Chart. Please give [place] a call if you do not hear from [place] after 2 weeks.  Please bring all of your medications with you to each visit.    Sign up for My Chart to have easy access to your labs results, and communication with your primary care physician.  Feel free to call with any questions or concerns at any time, at [phone].   Take care,  Dr. Rushie [facility]

## 2019-01-15 NOTE — Assessment & Plan Note (Addendum)
Hypertensive today.  Patient reports she has been out of her medications as she lost her insurance when she lost her job. Refill irbesartan-HCTZ 150-12.5 mg only.  CMP.  Reassess blood pressure and at follow-up in 1 month.

## 2019-01-15 NOTE — Assessment & Plan Note (Signed)
Refilled prescriptions for Wellbutrin 300 mg daily and Cymbalta 30 mg twice daily. Reassess with PHQ-9 at 1 month follow-up.

## 2019-01-15 NOTE — Progress Notes (Signed)
New Patient Office Visit  Subjective:  Patient ID: Loretta Brown, female    DOB: [birth year]  Age: 38 y.o. MRN: [id number]  CC:  Chief Complaint  Patient presents with  . Establish Care  . Depression  . Hypertension    HPI New Patient :  PMHx:  Anxiety / Depression Patient reports ongoing anxiety and depression.  States that her husband recently saw up with her best friend and they are getting divorced.  Says that she has been more upset recently because of her husbands affair. Takes Cymbalta and Wellbutrin however reports she has lost her job and has been having difficulty retrieving medications.  HTN Takes Irbesartan-HCTZ. Denies CP, palpations, shortness of breath, vision changes and lower extremity edema.   ADD Patient diagnosed at the age of 41. Takes Adderall.   ALL:  Hydrocodone.  Verify reaction at next visit.  SurgHx: left salpingectomy  2017, C-section 2019, laporotomy (hernia repair)   GYNHx: [id number], Salpingectomy due to ectopic prenancy   FMHx: Mom - anxiety-depression, HTN, Dad- depression, HTN, Paternal Grandmother - kidney disease, DM, aunts/uncles with depression    Social Hx:  Lives with daughter Maddie (73 yo) and son Mallie Mussel (68 yo), best friend Lambert Keto and Kellie's husband and their two kids. Currently in process for getting a divorce. Walks for exercise. Current 1/4 ppd for 25 years ( 6 pk years). Drinks alcohol occasionally.  Uses marijuana (smokes and gummies), used cocaine in the past.  Denies hx of IV drug use.    Review of Systems  Constitutional: Negative for fever.  HENT: Negative for rhinorrhea and sore throat.   Eyes: Negative for visual disturbance.  Respiratory: Negative for shortness of breath.   Cardiovascular: Negative for chest pain and palpitations.  Gastrointestinal: Negative for abdominal pain, nausea and vomiting.  Endocrine: Positive for heat intolerance.  Genitourinary: Negative for dysuria.  Skin: Negative for rash.   Neurological: Negative for dizziness, weakness and headaches.  Psychiatric/Behavioral: Positive for decreased concentration and dysphoric mood. Negative for suicidal ideas. The patient is nervous/anxious.     Objective:   Today's Vitals: BP (!) 148/92   Pulse 99   Wt 187 lb (84.8 kg)   SpO2 (!) 85%   BMI 33.13 kg/m   Physical Exam Vitals signs reviewed.  Constitutional:      General: She is not in acute distress.    Appearance: Normal appearance. She is not ill-appearing.  HENT:     Head: Normocephalic and atraumatic.     Right Ear: Tympanic membrane and ear canal normal. There is no impacted cerumen.     Left Ear: Tympanic membrane and ear canal normal. There is no impacted cerumen.     Mouth/Throat:     Mouth: Mucous membranes are moist.     Pharynx: Oropharynx is clear. No oropharyngeal exudate or posterior oropharyngeal erythema.  Eyes:     General: No scleral icterus.    Extraocular Movements: Extraocular movements intact.     Conjunctiva/sclera: Conjunctivae normal.     Pupils: Pupils are equal, round, and reactive to light.  Neck:     Musculoskeletal: Normal range of motion and neck supple. No neck rigidity.  Cardiovascular:     Rate and Rhythm: Normal rate and regular rhythm.     Pulses: Normal pulses.     Heart sounds: Normal heart sounds. No murmur.  Pulmonary:     Effort: Pulmonary effort is normal.     Breath sounds: Normal breath sounds.  Abdominal:  General: Bowel sounds are normal. There is no distension.     Palpations: Abdomen is soft. There is no mass.     Tenderness: There is no abdominal tenderness. There is no right CVA tenderness, left CVA tenderness, guarding or rebound.     Comments: Striae, lower midline laparotomy scar    Musculoskeletal: Normal range of motion.        General: No tenderness.     Right lower leg: No edema.     Left lower leg: No edema.  Lymphadenopathy:     Cervical: No cervical adenopathy.  Skin:    General: Skin is  warm.     Capillary Refill: Capillary refill takes less than 2 seconds.     Findings: No rash.  Neurological:     General: No focal deficit present.     Mental Status: She is alert and oriented to person, place, and time.     Comments: CN 2-12 grossly intact  Psychiatric:        Attention and Perception: Attention normal.        Mood and Affect: Mood is depressed. Affect is tearful.        Speech: Speech normal.        Behavior: Behavior normal.        Thought Content: Thought content normal.        Cognition and Memory: Cognition normal.        Judgment: Judgment normal.     Assessment & Plan:   Problem List Items Addressed This Visit      Cardiovascular and Mediastinum   Hypertension, essential    Hypertensive today.  Patient reports she has been out of her medications as she lost her insurance when she lost her job. Refill irbesartan-HCTZ 150-12.5 mg only.  CMP.  Reassess blood pressure and at follow-up in 1 month.      Relevant Medications   irbesartan-hydrochlorothiazide (AVALIDE) 150-12.5 MG tablet   Other Relevant Orders   Lipid Panel   Comprehensive metabolic panel   HgB 123456 (Completed)   TSH     Other   Screening for HIV without presence of risk factors   Relevant Orders   HIV Antibody (routine testing w rflx)   RPR   Depression with anxiety    Refilled prescriptions for Wellbutrin 300 mg daily and Cymbalta 30 mg twice daily. Reassess with PHQ-9 at 1 month follow-up.        Relevant Medications   DULoxetine (CYMBALTA) 30 MG capsule   buPROPion (WELLBUTRIN SR) 150 MG 12 hr tablet   ADD (attention deficit disorder)    Patient to wean off Adderall as she is hypertensive and and reports anxiety as well.  Taking 30 mg twice daily and will wean today to 20 mg ER daily.  Reassess in 1 month follow-up.  We will try to avoid tablets.  Continue to wean as tolerated.      Relevant Medications   amphetamine-dextroamphetamine (ADDERALL XR) 20 MG 24 hr capsule     Other Visit Diagnoses    Need for immunization against influenza       Relevant Orders   Flu Vaccine QUAD 36+ mos IM (Completed)      Outpatient Encounter Medications as of 01/15/2019  Medication Sig  . levonorgestrel (MIRENA) 20 MCG/24HR IUD 1 each by Intrauterine route once.  Marland Kitchen amphetamine-dextroamphetamine (ADDERALL XR) 20 MG 24 hr capsule Take 1 capsule (20 mg total) by mouth daily.  Marland Kitchen buPROPion (WELLBUTRIN SR) 150 MG 12 hr  tablet Take 2 tablets (300 mg total) by mouth daily.  . DULoxetine (CYMBALTA) 30 MG capsule Take 1 capsule (30 mg total) by mouth 2 (two) times daily.  . irbesartan-hydrochlorothiazide (AVALIDE) 150-12.5 MG tablet Take 1 tablet by mouth daily.  Marland Kitchen terbinafine (LAMISIL) 250 MG tablet Take 250 mg by mouth daily.  . [DISCONTINUED] amphetamine-dextroamphetamine (ADDERALL) 30 MG tablet Take 30 mg by mouth daily as needed (focus).   . [DISCONTINUED] buPROPion (WELLBUTRIN SR) 150 MG 12 hr tablet Take 300 mg by mouth daily.  . [DISCONTINUED] DULoxetine (CYMBALTA) 30 MG capsule Take 30 mg by mouth 2 (two) times daily.  . [DISCONTINUED] escitalopram (LEXAPRO) 20 MG tablet Take 20 mg by mouth daily.  . [DISCONTINUED] irbesartan-hydrochlorothiazide (AVALIDE) 150-12.5 MG tablet Take 1 tablet by mouth daily.  . [DISCONTINUED] labetalol (NORMODYNE) 100 MG tablet Take 100 mg by mouth daily.   . [DISCONTINUED] traMADol (ULTRAM) 50 MG tablet Take 1 tablet (50 mg total) by mouth every 6 (six) hours as needed (mild pain). (Patient not taking: Reported on 03/29/2018)   No facility-administered encounter medications on file as of 01/15/2019.     Follow-up: Return in about 1 week (around 01/22/2019) for PAP smear .   Lyndee Hensen, DO PGY-1, [facility] 01/15/2019 8:51 AM

## 2019-01-16 LAB — COMPREHENSIVE METABOLIC PANEL
ALT: 17 IU/L (ref 0–32)
AST: 20 IU/L (ref 0–40)
Albumin/Globulin Ratio: 1.8 (ref 1.2–2.2)
Albumin: 4.4 g/dL (ref 3.8–4.8)
Alkaline Phosphatase: 79 IU/L (ref 39–117)
BUN/Creatinine Ratio: 16 (ref 9–23)
BUN: 16 mg/dL (ref 6–20)
Bilirubin Total: 0.4 mg/dL (ref 0.0–1.2)
CO2: 26 mmol/L (ref 20–29)
Calcium: 9.2 mg/dL (ref 8.7–10.2)
Chloride: 99 mmol/L (ref 96–106)
Creatinine, Ser: 0.98 mg/dL (ref 0.57–1.00)
GFR calc Af Amer: 84 mL/min/{1.73_m2} (ref >59)
GFR calc non Af Amer: 73 mL/min/{1.73_m2} (ref >59)
Globulin, Total: 2.5 g/dL (ref 1.5–4.5)
Glucose: 85 mg/dL (ref 65–99)
Potassium: 4.1 mmol/L (ref 3.5–5.2)
Sodium: 138 mmol/L (ref 134–144)
Total Protein: 6.9 g/dL (ref 6.0–8.5)

## 2019-01-16 LAB — TSH: TSH: 2.1 u[IU]/mL (ref 0.450–4.500)

## 2019-01-16 LAB — RPR: RPR Ser Ql: NONREACTIVE

## 2019-01-16 LAB — HIV ANTIBODY (ROUTINE TESTING W REFLEX): HIV Screen 4th Generation wRfx: NONREACTIVE

## 2019-01-21 DIAGNOSIS — Z01419 Encounter for gynecological examination (general) (routine) without abnormal findings: Secondary | ICD-10-CM | POA: Insufficient documentation

## 2019-01-21 NOTE — Progress Notes (Signed)
    Subjective:  Loretta Brown is a 38 y.o. female who presents to the [facility] today with a chief complaint of PAP smear.   HPI:  [name] here for follow up and PAP smear.  PAP Smear  No hx of abnormal pap smear.Denies vaginal discharge, odor, pelvic pain, dysuria, back pain.  Pt is sexually active and requests STI testing.   ADD Taking Adderall.  Has tolerated switch to extended release mediatation. Reports ongoing anxiety and decreased concentration.  Patient also has HTN.    Anxiety/Depression  Taking Cymbalta irregularly and Wellbutrin regularly.  Reports ongoing anxiety, crying spells and sleep disturbance. Denies SI and HI. Expresses concerns regarding multiple life stressors including living with best friend and recent husband's affair.   HTN Takes Irebesartan-HCTZ. Denies missing doses of antihypertensive medications. Denies chest pain, palpitations, lower extremity edema, exertional dyspnea, headaches and vision changes.    [place], medications and smoking status reviewed.   ROS: See HPI     Objective:  Physical Exam: BP 112/90   Pulse 91   Ht 5\' 3"  (1.6 m)   Wt 186 lb (84.4 kg)   SpO2 100%   BMI 32.95 kg/m    GEN: pleasant, anxious appearing, diaphoretic CV: regular rate and rhythm, no murmurs appreciated RESP: no increased work of breathing, clear to ascultation bilaterally  GU: Pelvic exam: normal external genitalia, vulva, vagina, cervix, uterus and adnexa, PAP: Pap smear done today, HPV test, exam chaperoned by CMA. SKIN: warm, dry NEURO: grossly normal, moves all extremities appropriately PSYCH: tearful, anxious,   No results found for this or any previous visit (from the past 72 hour(s)).   Assessment/Plan:  Hypertension, essential BP today 112/90 after patient has been taking medication for past week. Continue with Irbesartan-HCTZ 150-12.5 mg. Fasting lipid panel today.   Depression with anxiety Continue taking 60 mg Cymbalta and Wellbutrin  150 mg.  Counseled patient on the need to take these medications regularly.  GAD 7 Score:  9.  PHQ9 Score 20.  Patient agrees with plan.  Consider additional anti-depressant or atypical antipsychotic and/or therapy referral at next visit.  Follow-up in one month.  Repeat GAD 7 an PHQ-9.    ADD (attention deficit disorder) Previous dose of 60 mg daily weaned to 20 mg daily.  Patient did not tolerate this change well.  Prescribed 30 mg Adderall XR daily. Reassess in one month. Continue to wean as tolerated. Patient asked for tablet and not XR capsule.  Patient diaphoretic on exam today.  Unsure if there are multiple substances at play here.  TSH obtained at last visit was normal.  Obtain UDS at next visit.  -Adderall 30 mg XR daily   Women's annual routine gynecological examination PAP smear today with HPV and GC/CT testing.  Previous PAP in 2017 normal.     No orders of the defined types were placed in this encounter.   Meds ordered this encounter  Medications  . amphetamine-dextroamphetamine (ADDERALL XR) 30 MG 24 hr capsule    Sig: Take 1 capsule (30 mg total) by mouth every morning.    Dispense:  30 capsule    Refill:  0    Health Maintenance reviewed - PAP today, otherwise UTD.   Lyndee Hensen, DO PGY-1, [facility] 01/22/2019 9:54 AM

## 2019-01-22 ENCOUNTER — Other Ambulatory Visit (HOSPITAL_COMMUNITY)
Admission: RE | Admit: 2019-01-22 | Discharge: 2019-01-22 | Disposition: A | Discharge status: Home / Self Care | Payer: Medicaid Other | Source: Ambulatory Visit | Attending: Family Medicine | Admitting: Family Medicine

## 2019-01-22 ENCOUNTER — Other Ambulatory Visit: Payer: Self-pay

## 2019-01-22 ENCOUNTER — Ambulatory Visit (INDEPENDENT_AMBULATORY_CARE_PROVIDER_SITE_OTHER): Payer: Medicaid Other | Admitting: Family Medicine

## 2019-01-22 ENCOUNTER — Encounter: Payer: Self-pay | Admitting: Family Medicine

## 2019-01-22 VITALS — BP 112/90 | HR 91 | Ht 63.0 in | Wt 186.0 lb

## 2019-01-22 DIAGNOSIS — Z01419 Encounter for gynecological examination (general) (routine) without abnormal findings: Secondary | ICD-10-CM

## 2019-01-22 DIAGNOSIS — F418 Other specified anxiety disorders: Secondary | ICD-10-CM | POA: Diagnosis not present

## 2019-01-22 DIAGNOSIS — F988 Other specified behavioral and emotional disorders with onset usually occurring in childhood and adolescence: Secondary | ICD-10-CM

## 2019-01-22 DIAGNOSIS — I10 Essential (primary) hypertension: Secondary | ICD-10-CM

## 2019-01-22 MED ORDER — TERBINAFINE HCL 250 MG PO TABS: 250.0000 mg | ORAL_TABLET | Freq: Every day | ORAL | 1 refills | Status: DC

## 2019-01-22 MED ORDER — AMPHETAMINE-DEXTROAMPHET ER 30 MG PO CP24: 30.0000 mg | ORAL_CAPSULE | ORAL | 0 refills | Status: DC

## 2019-01-22 NOTE — Assessment & Plan Note (Addendum)
PAP smear today with HPV and GC/CT testing.  Previous PAP in 2017 normal.

## 2019-01-22 NOTE — Assessment & Plan Note (Addendum)
Previous dose of 60 mg daily weaned to 20 mg daily.  Patient did not tolerate this change well.  Prescribed 30 mg Adderall XR daily. Reassess in one month. Continue to wean as tolerated. Patient asked for tablet and not XR capsule.  Patient diaphoretic on exam today.  Unsure if there are multiple substances at play here.  TSH obtained at last visit was normal.  Obtain UDS at next visit.  -Adderall 30 mg XR daily

## 2019-01-22 NOTE — Assessment & Plan Note (Signed)
BP today 112/90 after patient has been taking medication for past week. Continue with Irbesartan-HCTZ 150-12.5 mg. Fasting lipid panel today.

## 2019-01-22 NOTE — Assessment & Plan Note (Signed)
Continue taking 60 mg Cymbalta and Wellbutrin 150 mg.  Counseled patient on the need to take these medications regularly.  GAD 7 Score:  9.  PHQ9 Score 20.  Patient agrees with plan.  Consider additional anti-depressant or atypical antipsychotic and/or therapy referral at next visit.  Follow-up in one month.  Repeat GAD 7 an PHQ-9.

## 2019-01-22 NOTE — Patient Instructions (Addendum)
It was great seeing you today!  - pick up medication from pharmacy  - Take 2 tablets of Cymbalta daily!  - Remember to take all of your medications regularly!   I'd like to see you back 1 month but if you need to be seen earlier than that for any new issues we're happy to fit you in, just give [place] a call!   If you have questions or concerns please do not hesitate to call at [phone].   Preventive Care 66-39 Years Old, Female Preventive care refers to visits with your health care provider and lifestyle choices that can promote health and wellness. This includes:  A yearly physical exam. This may also be called an annual well check.  Regular dental visits and eye exams.  Immunizations.  Screening for certain conditions.  Healthy lifestyle choices, such as eating a healthy diet, getting regular exercise, not using drugs or products that contain nicotine and tobacco, and limiting alcohol use. What can I expect for my preventive care visit? Physical exam Your health care provider will check your:  Height and weight. This may be used to calculate body mass index (BMI), which tells if you are at a healthy weight.  Heart rate and blood pressure.  Skin for abnormal spots. Counseling Your health care provider may ask you questions about your:  Alcohol, tobacco, and drug use.  Emotional well-being.  Home and relationship well-being.  Sexual activity.  Eating habits.  Work and work Statistician.  Method of birth control.  Menstrual cycle.  Pregnancy history. What immunizations do I need?  Influenza (flu) vaccine  This is recommended every year. Tetanus, diphtheria, and pertussis (Tdap) vaccine  You may need a Td booster every 10 years. Varicella (chickenpox) vaccine  You may need this if you have not been vaccinated. Human papillomavirus (HPV) vaccine  If recommended by your health care provider, you may need three doses over 6 months. Measles, mumps, and  rubella (MMR) vaccine  You may need at least one dose of MMR. You may also need a second dose. Meningococcal conjugate (MenACWY) vaccine  One dose is recommended if you are age 47-21 years and a first-year college student living in a residence hall, or if you have one of several medical conditions. You may also need additional booster doses. Pneumococcal conjugate (PCV13) vaccine  You may need this if you have certain conditions and were not previously vaccinated. Pneumococcal polysaccharide (PPSV23) vaccine  You may need one or two doses if you smoke cigarettes or if you have certain conditions. Hepatitis A vaccine  You may need this if you have certain conditions or if you travel or work in places where you may be exposed to hepatitis A. Hepatitis B vaccine  You may need this if you have certain conditions or if you travel or work in places where you may be exposed to hepatitis B. Haemophilus influenzae type b (Hib) vaccine  You may need this if you have certain conditions. You may receive vaccines as individual doses or as more than one vaccine together in one shot (combination vaccines). Talk with your health care provider about the risks and benefits of combination vaccines. What tests do I need?  Blood tests  Lipid and cholesterol levels. These may be checked every 5 years starting at age 53.  Hepatitis C test.  Hepatitis B test. Screening  Diabetes screening. This is done by checking your blood sugar (glucose) after you have not eaten for a while (fasting).  Sexually transmitted  disease (STD) testing.  BRCA-related cancer screening. This may be done if you have a family history of breast, ovarian, tubal, or peritoneal cancers.  Pelvic exam and Pap test. This may be done every 3 years starting at age 97. Starting at age 53, this may be done every 5 years if you have a Pap test in combination with an HPV test. Talk with your health care provider about your test results,  treatment options, and if necessary, the need for more tests. Follow these instructions at home: Eating and drinking   Eat a diet that includes fresh fruits and vegetables, whole grains, lean protein, and low-fat dairy.  Take vitamin and mineral supplements as recommended by your health care provider.  Do not drink alcohol if: ? Your health care provider tells you not to drink. ? You are pregnant, may be pregnant, or are planning to become pregnant.  If you drink alcohol: ? Limit how much you have to 0-1 drink a day. ? Be aware of how much alcohol is in your drink. In the [place] one drink equals one 12 oz bottle of beer (355 mL), one 5 oz glass of wine (148 mL), or one 1 oz glass of hard liquor (44 mL). Lifestyle  Take daily care of your teeth and gums.  Stay active. Exercise for at least 30 minutes on 5 or more days each week.  Do not use any products that contain nicotine or tobacco, such as cigarettes, e-cigarettes, and chewing tobacco. If you need help quitting, ask your health care provider.  If you are sexually active, practice safe sex. Use a condom or other form of birth control (contraception) in order to prevent pregnancy and STIs (sexually transmitted infections). If you plan to become pregnant, see your health care provider for a preconception visit. What's next?  Visit your health care provider once a year for a well check visit.  Ask your health care provider how often you should have your eyes and teeth checked.  Stay up to date on all vaccines. This information is not intended to replace advice given to you by your health care provider. Make sure you discuss any questions you have with your health care provider. Document Released: 05/10/2001 Document Revised: 11/23/2017 Document Reviewed: 11/23/2017 Elsevier Patient Education  2020 [employer].

## 2019-01-23 ENCOUNTER — Telehealth: Payer: Self-pay | Admitting: Family Medicine

## 2019-01-23 DIAGNOSIS — I10 Essential (primary) hypertension: Secondary | ICD-10-CM

## 2019-01-23 LAB — LIPID PANEL
Chol/HDL Ratio: 5.6 ratio — ABNORMAL HIGH (ref 0.0–4.4)
Cholesterol, Total: 274 mg/dL — ABNORMAL HIGH (ref 100–199)
HDL: 49 mg/dL (ref >39)
LDL Chol Calc (NIH): 98 mg/dL (ref 0–99)
Triglycerides: 749 mg/dL (ref 0–149)
VLDL Cholesterol Cal: 127 mg/dL — ABNORMAL HIGH (ref 5–40)

## 2019-01-23 NOTE — Telephone Encounter (Signed)
Patient returning PCP phone call. I informed her of results, she did say she was fasting. I scheduled patient for 10/30 at 830a. Patient to fast as well for this apt. Patient voiced understanding.

## 2019-01-23 NOTE — Telephone Encounter (Signed)
Called patient regarding her abnormal lipid panel. Left voicemail for patient to return phone call. Patient reported that she was fasting yesterday prior to obtaining lipid panel.  Critical result TGs 749.  Patient needs to be fasting and we will repeat lipid panel.  Future order placed.

## 2019-01-25 ENCOUNTER — Other Ambulatory Visit: Payer: Self-pay

## 2019-01-25 ENCOUNTER — Other Ambulatory Visit: Payer: Medicaid Other

## 2019-01-25 DIAGNOSIS — I10 Essential (primary) hypertension: Secondary | ICD-10-CM | POA: Diagnosis not present

## 2019-01-26 LAB — LIPID PANEL
Chol/HDL Ratio: 5 ratio — ABNORMAL HIGH (ref 0.0–4.4)
Cholesterol, Total: 249 mg/dL — ABNORMAL HIGH (ref 100–199)
HDL: 50 mg/dL (ref >39)
LDL Chol Calc (NIH): 105 mg/dL — ABNORMAL HIGH (ref 0–99)
Triglycerides: 553 mg/dL (ref 0–149)
VLDL Cholesterol Cal: 94 mg/dL — ABNORMAL HIGH (ref 5–40)

## 2019-01-28 LAB — CYTOLOGY - PAP
Chlamydia: NEGATIVE
Comment: NEGATIVE
Comment: NEGATIVE
Comment: NEGATIVE
Comment: NORMAL
Diagnosis: NEGATIVE
High risk HPV: NEGATIVE
Neisseria Gonorrhea: NEGATIVE
Trichomonas: NEGATIVE

## 2019-02-03 ENCOUNTER — Telehealth: Payer: Self-pay | Admitting: Family Medicine

## 2019-02-06 NOTE — Telephone Encounter (Signed)
Late entry.  Called patient to discuss elevated triglycerides and cholesterol.  Voicemail left as there was no answer.  Will discuss further in depth on upcoming visit on 02/28/2019.  Encourage patient to modify lifestyle changes versus starting statin therapy.  10-year ASCVD risk low at this time.

## 2019-02-08 ENCOUNTER — Other Ambulatory Visit: Payer: Self-pay | Admitting: Family Medicine

## 2019-02-08 DIAGNOSIS — F418 Other specified anxiety disorders: Secondary | ICD-10-CM

## 2019-02-28 ENCOUNTER — Telehealth (INDEPENDENT_AMBULATORY_CARE_PROVIDER_SITE_OTHER): Payer: Medicaid Other | Admitting: Family Medicine

## 2019-02-28 ENCOUNTER — Other Ambulatory Visit: Payer: Self-pay

## 2019-02-28 DIAGNOSIS — E785 Hyperlipidemia, unspecified: Secondary | ICD-10-CM | POA: Diagnosis not present

## 2019-02-28 DIAGNOSIS — F418 Other specified anxiety disorders: Secondary | ICD-10-CM

## 2019-02-28 NOTE — Progress Notes (Signed)
[facility] Telemedicine Visit  Patient consented to have virtual visit. Method of visit: Video  Encounter participants: Patient: Loretta Brown - located at car Provider: Lyndee Hensen - located at [facility]  Others (if applicable):   Chief Complaint: follow up   HPI:  [name] is a 38 y.o. female presents for follow-up.   Anxiety/depression Patient reports during Covid she has been more isolated has recently lost weight as she is not eating that much.  She feels tired all the time and finds herself getting short with her children.  No suicide or homicidal ideations.  Denies auditory and visual hallucinations.  State her anxiety/depression is no better and no worse than prior visit.  Has not yet spoken to a therapist.  Has been drinking 5-6 hard seltzer (White [employer]'s) with her best friend nightly.   Hyperlipidemia Here for discuss elevated lipid panel lab results.    ROS: per HPI  Pertinent PMHx: Anxiety, depression, adult ADD, tobacco abuse sorter  Exam:  General: Well developed female in no acute distress Respiratory: Speaking in full sentences without pause Psych: Tearful at times, normal thought content, no suicidal, homicidal ideations   Assessment/Plan:  Depression with anxiety Continue taking 60 mg Cymbalta and 150 mg Wellbutrin.  Patient is tolerating weaning of Adderall.  As this was a virtual visit unable to obtain accurate scoring of PHQ.  Advised patient to speak with psychologist / therapist that she has not yet followed up with.  -Repeat GAD-7 and PHQ-9 at next visit -Revisit psychiatric referral for medication management at next visit   Hyperlipidemia Discussed risk versus benefits starting statin therapy.  Patient declined starting statin at this time.  Wishes to pursue lifestyle modifications (diet and exercise) before starting medication she does not want to have any additional medication on her med list.   Lipid Panel      Component Value Date/Time   CHOL 249 (H) 01/25/2019 0847   TRIG 553 ([place]) 01/25/2019 0847   HDL 50 01/25/2019 0847   CHOLHDL 5.0 (H) 01/25/2019 0847   LDLCALC 105 (H) 01/25/2019 0847   LABVLDL 94 (H) 01/25/2019 0847   -Reassess lifestyle modifications versus statin therapy -Repeat lipid panel     Time spent during visit with patient: 26 minutes

## 2019-02-28 NOTE — Patient Instructions (Signed)
It was great seeing you today!   I'd like to see you back 3 months for follow up but if you need to be seen earlier than that for any new issues we're happy to fit you in, just give [place] a call!   If you have questions or concerns please do not hesitate to call at [phone].  Dr. [facility]

## 2019-03-05 DIAGNOSIS — E785 Hyperlipidemia, unspecified: Secondary | ICD-10-CM | POA: Insufficient documentation

## 2019-03-05 NOTE — Assessment & Plan Note (Signed)
Discussed risk versus benefits starting statin therapy.  Patient declined starting statin at this time.  Wishes to pursue lifestyle modifications (diet and exercise) before starting medication she does not want to have any additional medication on her med list.   Lipid Panel     Component Value Date/Time   CHOL 249 (H) 01/25/2019 0847   TRIG 553 ([place]) 01/25/2019 0847   HDL 50 01/25/2019 0847   CHOLHDL 5.0 (H) 01/25/2019 0847   LDLCALC 105 (H) 01/25/2019 0847   LABVLDL 94 (H) 01/25/2019 0847   -Reassess lifestyle modifications versus statin therapy -Repeat lipid panel

## 2019-03-05 NOTE — Assessment & Plan Note (Addendum)
Continue taking 60 mg Cymbalta and 150 mg Wellbutrin.  Patient is tolerating weaning of Adderall.  As this was a virtual visit unable to obtain accurate scoring of PHQ.  Advised patient to speak with psychologist / therapist that she has not yet followed up with.  -Repeat GAD-7 and PHQ-9 at next visit -Revisit psychiatric referral for medication management at next visit

## 2019-03-18 ENCOUNTER — Other Ambulatory Visit: Payer: Self-pay | Admitting: Family Medicine

## 2019-03-25 ENCOUNTER — Other Ambulatory Visit: Payer: Self-pay

## 2019-03-25 DIAGNOSIS — F988 Other specified behavioral and emotional disorders with onset usually occurring in childhood and adolescence: Secondary | ICD-10-CM

## 2019-03-25 MED ORDER — AMPHETAMINE-DEXTROAMPHET ER 30 MG PO CP24: 30.0000 mg | ORAL_CAPSULE | ORAL | 0 refills | Status: DC

## 2019-03-27 ENCOUNTER — Other Ambulatory Visit: Payer: Self-pay | Admitting: Family Medicine

## 2019-03-27 ENCOUNTER — Telehealth: Payer: Self-pay | Admitting: Family Medicine

## 2019-03-27 DIAGNOSIS — F988 Other specified behavioral and emotional disorders with onset usually occurring in childhood and adolescence: Secondary | ICD-10-CM

## 2019-03-27 MED ORDER — AMPHETAMINE-DEXTROAMPHET ER 30 MG PO CP24: 30.0000 mg | ORAL_CAPSULE | ORAL | 0 refills | Status: DC

## 2019-03-27 NOTE — Telephone Encounter (Signed)
Pt is calling to check on the status of her adderall being refilled. It looks like it was approved on 12/28 but it was "printed" and not sent to the pharmacy.   Pt is going out of town today and would like to have this sent to the pharmacy as soon as possible.  Please call pt to let her know when this is resent.

## 2019-03-27 NOTE — Telephone Encounter (Signed)
Sent to the pharmacy

## 2019-05-08 DIAGNOSIS — M79651 Pain in right thigh: Secondary | ICD-10-CM | POA: Diagnosis not present

## 2019-05-10 DIAGNOSIS — M79651 Pain in right thigh: Secondary | ICD-10-CM | POA: Diagnosis not present

## 2019-05-14 DIAGNOSIS — S76311D Strain of muscle, fascia and tendon of the posterior muscle group at thigh level, right thigh, subsequent encounter: Secondary | ICD-10-CM | POA: Diagnosis not present

## 2019-05-14 DIAGNOSIS — M79651 Pain in right thigh: Secondary | ICD-10-CM | POA: Diagnosis not present

## 2019-05-28 ENCOUNTER — Telehealth: Payer: Self-pay

## 2019-05-28 ENCOUNTER — Other Ambulatory Visit: Payer: Self-pay | Admitting: Family Medicine

## 2019-05-28 DIAGNOSIS — F988 Other specified behavioral and emotional disorders with onset usually occurring in childhood and adolescence: Secondary | ICD-10-CM

## 2019-05-28 MED ORDER — AMPHETAMINE-DEXTROAMPHET ER 25 MG PO CP24: 25.0000 mg | ORAL_CAPSULE | ORAL | 0 refills | Status: DC

## 2019-05-29 NOTE — Telephone Encounter (Signed)
Patient LVM on nurse line asking why her adderall prescription was written for 25mg  and not 30mg . Per chart review, looks like provider is weaning her down. I attempted to call patient to discuss.

## 2019-05-30 DIAGNOSIS — M25511 Pain in right shoulder: Secondary | ICD-10-CM | POA: Diagnosis not present

## 2019-05-30 DIAGNOSIS — S76311D Strain of muscle, fascia and tendon of the posterior muscle group at thigh level, right thigh, subsequent encounter: Secondary | ICD-10-CM | POA: Diagnosis not present

## 2019-05-30 DIAGNOSIS — M79651 Pain in right thigh: Secondary | ICD-10-CM | POA: Diagnosis not present

## 2019-06-25 ENCOUNTER — Ambulatory Visit: Payer: Medicaid Other | Admitting: Family Medicine

## 2019-07-03 DIAGNOSIS — M25511 Pain in right shoulder: Secondary | ICD-10-CM | POA: Diagnosis not present

## 2019-07-03 DIAGNOSIS — M79651 Pain in right thigh: Secondary | ICD-10-CM | POA: Diagnosis not present

## 2019-07-03 DIAGNOSIS — S76311D Strain of muscle, fascia and tendon of the posterior muscle group at thigh level, right thigh, subsequent encounter: Secondary | ICD-10-CM | POA: Diagnosis not present

## 2019-07-04 NOTE — Assessment & Plan Note (Addendum)
Unstable chronic. Continue taking Cymbalta 30 mg BID and 300 mg Wellbutrin XL daily. Patient very anxious and diaphoretic today. Advised patient that Adderall is not a medication used to treat depression and can make depression and anxiety worse in some instances. Discussed altering her mood modifying medications. Patient agreeable but does not want to continue the wean of Adderrall.  Precepted patient with Dr. Hartford Poli who recommended patient call [facility] to set up both psychiatry and therapy. 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 Patient should follow up with psychiatry and psychology. Patient agrees with plan.PHQ9: score 22 (very difficult) GAD7: 12 (somewhat difficult)  - Repeat GAD7 and PHQ9 at 1 month follow up  - Patient would benefit from following with a therapist and needs psychiatrist for medication management.  (see additional details in ADD A&P below)
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
## 2019-07-04 NOTE — Assessment & Plan Note (Addendum)
Patient not tolerating Adderrall wean.  Started at 60 mg and down to 25 mg daily. Patient states she is "not interested in weaning off Adderrall." Discussed need to wean this medication. Referral list given to patient to seek medication management with a psychiatrist.  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 Patient states she sometimes gets the [place] from her friend. She had varying requests for this medication: non-extended release version, increased dosing and hand written post dated prescriptions. Unfortunately, I suspect the patient is abusing this medication. [facility] phone number provided and patient declined calling during office visit. Explained to patient the need to establish care with psychiatry and a behavioral health therapist. Patient to call and set up an appointment to establish care with psychiatry.  - Continue 25 mg Adderrall XR daily  - Patient to seek medication management with psychiatry.  Patient agreeable.  Resource lis given to patient.  Will discontinue prescribing this medication once patient establishes with psychiatry

## 2019-07-05 ENCOUNTER — Encounter: Payer: Self-pay | Admitting: Family Medicine

## 2019-07-05 ENCOUNTER — Ambulatory Visit (INDEPENDENT_AMBULATORY_CARE_PROVIDER_SITE_OTHER): Payer: Medicaid Other | Admitting: Family Medicine

## 2019-07-05 ENCOUNTER — Other Ambulatory Visit: Payer: Self-pay

## 2019-07-05 VITALS — BP 172/112 | HR 88 | Ht 63.0 in | Wt 197.6 lb

## 2019-07-05 DIAGNOSIS — F418 Other specified anxiety disorders: Secondary | ICD-10-CM | POA: Diagnosis not present

## 2019-07-05 DIAGNOSIS — F988 Other specified behavioral and emotional disorders with onset usually occurring in childhood and adolescence: Secondary | ICD-10-CM

## 2019-07-05 DIAGNOSIS — Z79899 Other long term (current) drug therapy: Secondary | ICD-10-CM | POA: Diagnosis not present

## 2019-07-05 DIAGNOSIS — B372 Candidiasis of skin and nail: Secondary | ICD-10-CM | POA: Diagnosis not present

## 2019-07-05 DIAGNOSIS — I10 Essential (primary) hypertension: Secondary | ICD-10-CM | POA: Diagnosis not present

## 2019-07-05 LAB — POCT URINE DRUG SCREEN
Buprenorphine, Urine: NOT DETECTED
POC Amphetamine UR: NOT DETECTED
POC BENZODIAZEPINES UR: NOT DETECTED
POC Barbiturate UR: NOT DETECTED
POC Cocaine UR: NOT DETECTED
POC Ecstasy UR: NOT DETECTED
POC Marijuana UR: POSITIVE — AB
POC Methadone UR: NOT DETECTED
POC Methamphetamine UR: POSITIVE — AB
POC Opiate Ur: NOT DETECTED
POC Oxycodone UR: NOT DETECTED
POC PH URINE: NORMAL
POC PHENCYCLIDINE UR: NOT DETECTED
POC SPECIFIC GRAVITY URINE: NORMAL
URINE TEMPERATURE: 96 Degrees F (ref 90.0–100.0)

## 2019-07-05 MED ORDER — NYSTATIN 100000 UNIT/GM EX POWD: 1.0000 "application " | Freq: Three times a day (TID) | CUTANEOUS | 0 refills | Status: DC

## 2019-07-05 MED ORDER — AMLODIPINE BESYLATE 5 MG PO TABS: 5.0000 mg | ORAL_TABLET | Freq: Every day | ORAL | 0 refills | Status: DC

## 2019-07-05 NOTE — Patient Instructions (Signed)
It was great seeing you today!   I'd like to see you back 1 month but if you need to be seen earlier than that for any new issues we're happy to fit you in, just give [place] a call!   If you have questions or concerns please do not hesitate to call at [phone].  - See list below for therapy resources.    Dr. [facility]     Therapy and Counseling Resources Most providers on this list will take Medicaid. Patients with commercial insurance or Medicare should contact their insurance company to get a list of in network providers.  [facility]  [address], [place], [place] [address]      [phone]  [place] [address], [address] [place], [place] [address]       [place] [address], [place], [place]    [facility] Bl[facility] [address] [place], [place]  [facility]:  [place]. [place]  [facility]:  [place] [address] Loni Muse, [place]  [facility] (under & uninsured) [address] [place] [phone]    [email or web address]    [facility] [place]     Phone:  [phone]     [place]  [place] [address]      [place], [place] [address] [place]: [place], [place], [place]   [place] ([place] therapist) [address] [place] [address]   [place] [address]    [phone]    [facility]   [place],  [place], [place]   [place]  [place]  [facility]  [address] [place], [place]        [phone]  Open Access/Walk In  Clinic under & uninsured [place], To schedule an appointment call [phone]- [phone] [address], [place] [phone]):  [place] - Fri from 8 AM - 3 PM  [facility] [place],  ([place])   [place], [place]: [phone]) 8:30 - 12; 1 - 2:30  [facility] the [place],  [place], [place]    [phone]):8:30 - 12; 2 - 3PM  [facility] [place],  [address],  [place]; [phone]):   Mon - Fri 8 AM - 5 PM  [facility] [place]  MWF 12:30 to 3:00 or call to schedule an appointment  [phone]  Specific Provider options Psychology Today  [email or web address] 1. click on find a therapist  2. enter your zip code 3. left side and select or tailor a therapist for your specific need.   [facility] Provider Directory [email or web address]  (Medicaid)   Follow all drop down to find a provider  [place] or [email or web address] [address], [place], [place] Recovery support and educational   In home counseling Mobile Telephone: [phone]  office in [place] [email or web address]   Does not take reg. Medicaid or Medicare private insurance [facility], [place] health Choice, [place],  [employer], [place], [place], [place] Health Choice  24- Hour Availability:  . [place] or [phone]  . Family Service of the [employer] [phone]  [facility] Crisis Service  [phone]   . [place]  [phone] (after hours)  . Therapeutic Alternative/Mobile Crisis   [phone]  . [place] National Suicide Hotline  519-524-4767 ([place])  . Call 911 or go to emergency room  . [employer]  [phone]);  [place] and [employer]   . [facility] ACCESS  [phone]); [place], [place], [place],  [place], [place], [place], [place]

## 2019-07-07 ENCOUNTER — Other Ambulatory Visit: Payer: Self-pay | Admitting: Family Medicine

## 2019-07-07 DIAGNOSIS — I10 Essential (primary) hypertension: Secondary | ICD-10-CM

## 2019-07-08 MED ORDER — DULOXETINE HCL 30 MG PO CPEP: 30.0000 mg | ORAL_CAPSULE | Freq: Two times a day (BID) | ORAL | 2 refills | Status: DC

## 2019-07-08 MED ORDER — AMPHETAMINE-DEXTROAMPHET ER 25 MG PO CP24: 25.0000 mg | ORAL_CAPSULE | ORAL | 0 refills | Status: DC

## 2019-07-08 MED ORDER — IRBESARTAN-HYDROCHLOROTHIAZIDE 150-12.5 MG PO TABS: 1.0000 | ORAL_TABLET | Freq: Every day | ORAL | 1 refills | Status: DC

## 2019-07-08 MED ORDER — BUPROPION HCL ER (SR) 150 MG PO TB12: 300.0000 mg | ORAL_TABLET | Freq: Every day | ORAL | 2 refills | Status: DC

## 2019-07-09 ENCOUNTER — Telehealth: Payer: Self-pay | Admitting: Family Medicine

## 2019-07-09 DIAGNOSIS — F988 Other specified behavioral and emotional disorders with onset usually occurring in childhood and adolescence: Secondary | ICD-10-CM

## 2019-07-09 DIAGNOSIS — I10 Essential (primary) hypertension: Secondary | ICD-10-CM

## 2019-07-09 DIAGNOSIS — F418 Other specified anxiety disorders: Secondary | ICD-10-CM

## 2019-07-09 MED ORDER — IRBESARTAN-HYDROCHLOROTHIAZIDE 150-12.5 MG PO TABS: 1.0000 | ORAL_TABLET | Freq: Every day | ORAL | 1 refills | Status: DC

## 2019-07-09 MED ORDER — DULOXETINE HCL 30 MG PO CPEP: 30.0000 mg | ORAL_CAPSULE | Freq: Two times a day (BID) | ORAL | 2 refills | Status: DC

## 2019-07-09 MED ORDER — BUPROPION HCL ER (SR) 150 MG PO TB12: 300.0000 mg | ORAL_TABLET | Freq: Every day | ORAL | 2 refills | Status: AC

## 2019-07-09 MED ORDER — AMPHETAMINE-DEXTROAMPHET ER 20 MG PO CP24: 20.0000 mg | ORAL_CAPSULE | ORAL | 0 refills | Status: AC

## 2019-07-09 NOTE — Telephone Encounter (Signed)
Will forward to MD to place referral for this provider as patient has an upcoming appointment on 07-11-19 per last telephone note.  Oisin Yoakum,CMA

## 2019-07-09 NOTE — Telephone Encounter (Signed)
Asked to refill meds - for some reason the refills Dr. Susa Simmonds sent in were not received.  The main issue is the adderall.  Reviewed notes that talk of weaning of adderall.  Has gone from 60 to 30 to 52 ("did not tolerate") to 25.  Has been on 25 for at least one month.  While I am not the PCP, I feel it is important to continue to wean to the point of discontinuing the adderall.  It is a high risk medication and can contribute to her complaints of anxiety and sleeplessness.   Will discuss with Dr. Susa Simmonds to set up a slow weaning schedule that is not dependant on the patient's subjective complaints.  If the patient feels she need adderall, she should seek psychiatric care.  I would draw the line that we are unwilling to continue to prescribe at this office once the slow wean is complete.

## 2019-07-09 NOTE — Telephone Encounter (Signed)
Patient would like to know if she can have a referral placed to see psychiatrist Dr. Darleene Cleaver. She does not remember the name of the practice.  She said she has discussed with Dr. Susa Simmonds but the first office she called can not get her in for a month.

## 2019-07-09 NOTE — Telephone Encounter (Signed)
I am sorry but not surprised that the patient is upset.  Clearly, there have been frequent and longstanding discussions to wean her adderall.  Given the concerns that: Adderall is not a first or second line treatment for depression. Adderall may worsen anxiety and insomnia. Adderall is a frequently abused medication. Prescribing adderall for this patient is outside our scope of practice. The plan remains for [place] to continue a slow wean of adderrall with the intent to discontinue altogether and not restart.  The wean continues independent of the patient's subjective complaints.  We are happy to turn over the prescribing of psychotropic medications to a psychiatrist. Until then, we we proceed with the above plan.  I fully support Nurse Pipkin's documentation.  I will not discuss further over the phone.  Further discussions require a visit.

## 2019-07-09 NOTE — Assessment & Plan Note (Addendum)
Hypertensive today. Recheck 164/96.  Add amlodipine to current regimen.  - Amlodipine 5 mg daily  - Irbesartan - HCTZ 150-12.5mg   - Follow up in 1 month

## 2019-07-09 NOTE — Telephone Encounter (Signed)
Patient called and informed of refills being sent to pharmacy and lowered dosage of adderall. Patient became very upset and tearful, as she states that this was not discussed with her. Patient states that she has an intake appointment with psychiatrist on Thursday, 07/11/19.   Requesting phone call from provider to discuss rx dosage change further.   Talbot Grumbling, RN

## 2019-07-10 DIAGNOSIS — B372 Candidiasis of skin and nail: Secondary | ICD-10-CM | POA: Insufficient documentation

## 2019-07-10 NOTE — Assessment & Plan Note (Signed)
Refill Nystatin powder.  - Follow up if sx not improving or worsening with treatment

## 2019-07-10 NOTE — Addendum Note (Signed)
Addended byLyndee Hensen D on: 07/10/2019 10:22 AM   Modules accepted: Orders

## 2019-07-11 NOTE — Telephone Encounter (Signed)
Will forward to referral coordinator to let her know that this referral has been placed. Laylah Riga,CMA

## 2019-07-17 ENCOUNTER — Ambulatory Visit: Payer: Self-pay | Admitting: Licensed Clinical Social Worker

## 2019-07-17 ENCOUNTER — Ambulatory Visit: Payer: Medicaid Other | Admitting: Licensed Clinical Social Worker

## 2019-07-17 DIAGNOSIS — Z23 Encounter for immunization: Secondary | ICD-10-CM | POA: Diagnosis not present

## 2019-07-17 DIAGNOSIS — F418 Other specified anxiety disorders: Secondary | ICD-10-CM

## 2019-07-17 DIAGNOSIS — Z7189 Other specified counseling: Secondary | ICD-10-CM

## 2019-07-17 NOTE — Patient Instructions (Signed)
Licensed Clinical Social Worker Visit Information Ms. Gaskell  it was nice speaking with you. Please call me directly if you have questions [phone] Goals we discussed today: connect with psychiatry  Goals Addressed            This Visit's Progress   . Connect with pscy and counseling       CARE PLAN ENTRY (see longitudinal plan of care for additional care plan information) Current Barriers & Progress:  . Patient with Anxiety and Depression acknowledges deficits with connecting to mental health provider for medication management and ongoing counseling.  . Patient needs Support, Education, and Care Coordination in order to meet unmet mental health needs  . Patient has completed assessment with [facility] however she does not want to manage her medication . Patient called [facility], they require a referral from PCP. Marland Kitchen Patient waiting for call from [place] to schedule her first therapy session  Clinical Social Work Goal(s):  Marland Kitchen Over the next 30 days,  o patient will work with [facility] to reduce or manage symptoms of anxiety and depression   o Will connect with psychiatry for ongoing medication needs Interventions:  . Assessed patient's understanding, education, previous treatment and care coordination needs  . Collaborated with appropriate clinical care team members regarding patient needs Kennyth Lose placed referral to Dr. Loni Muse at [facility] . Discussed several options for medication management based on need and insurance. Patient wanted [facility] . Other interventions include: Solution-Focused Strategies  Patient Self Care Activities & Deficits:  . Patient is unable to independently navigate community resource options without care coordination support . Patient is able to keep therapy appointment with [facility] and is motivated for treatment  Initial goal documentation      Materials provided:  Ms. Gant received Care  Management services today:  1. Care Management services include personalized support from designated clinical staff supervised by her physician, including individualized plan of care and coordination with other care providers 2. 24/7 contact [phone] for assistance for urgent and routine care needs. 3. Care Management are voluntary services and be declined at any time by calling the office. Patient verbally agreed to assistance and services provided by embedded care coordination/care management team today.   Patient verbalizes understanding of instructions provided today.  Follow up plan:  SW will follow up with patient by phone over the next week  Maurine Cane, LCSW

## 2019-07-17 NOTE — Chronic Care Management (AMB) (Signed)
  Social Work Care Management  Unsuccessful Phone Outreach   07/17/2019 Name: Loretta Brown MRN: [id number] DOB: 1980-12-10  Referred by: Lyndee Hensen, DO,  Reason for referral : Care Coordination (connect with psychiatry )   Loretta Brown is a 39 y.o. year old female who sees [facility], [place], DO for primary care.    Called patient to assess needs and barriers reference the above referral. Telephone outreach was unsuccessful. Unable to leave a HIPPA compliant phone message as voice mail was not set up. Plan: LCSW will call again today.  Casimer Lanius, [place] / [place]   [phone] 8:34 AM

## 2019-07-17 NOTE — Chronic Care Management (AMB) (Signed)
Care Management   Clinical Social Work initial Note  07/17/2019 Name: Loretta Brown MRN: [id number] DOB: 1980-11-26  The Care Management team was consulted by PCP to assist the patient with connecting patient to psychiatry services.  Loretta Brown is a 39 y.o. year old female who sees [place], McCall[place], DO for primary care. LCSW reached out to Loretta Brown today by phone to introduce self, assess needs and barriers to care.   Assessment: Patient is pleasant and engaged in conversation.  Experiencing barriers with connecting to psychiatry for medication management due to needing a referral placed by PCP.     Review of patient status, including review of consultants reports, relevant laboratory and other test results, and collaboration with appropriate care team members and the patient's provider was performed as part of comprehensive patient evaluation and provision of chronic care management services.    SDOH (Social Determinants of Health) assessments performed: Yes:  SDOH Interventions     Most Recent Value  SDOH Interventions  SDOH Interventions for the Following Domains  Depression  Depression Interventions/Treatment   Referral to Psychiatry, Counseling      Goals Addressed            This Visit's Progress   . Connect with pscy and counseling       CARE PLAN ENTRY (see longitudinal plan of care for additional care plan information) Current Barriers & Progress:  . Patient with Anxiety and Depression acknowledges deficits with connecting to mental health provider for medication management and ongoing counseling.  . Patient needs Support, Education, and Care Coordination in order to meet unmet mental health needs  . Patient has completed assessment with Loretta Brown however she does not want them to manage her medication . Patient called [facility], they require a referral from PCP. Marland Kitchen Patient waiting for call from [place] to schedule her first therapy session    Clinical Social Work Goal(s):  Marland Kitchen Over the next 30 days,  o patient will work with Loretta Brown to reduce or manage symptoms of anxiety and depression   o Will connect with psychiatry for ongoing medication needs Interventions:  . Assessed patient's understanding, education, previous treatment and care coordination needs  . Collaborated with appropriate clinical care team members regarding patient needs Loretta Brown placed referral to Dr. Loni Muse at [facility] . Discussed several options for medication management based on need and insurance. Patient wanted [facility] . Other interventions include: Brown-Focused Strategies  Patient Self Care Activities & Deficits:  . Patient is unable to independently navigate community resource options without care coordination support . Patient is able to keep therapy appointment with Loretta Alstrom and is motivated for treatment  Initial goal documentation        Outpatient Encounter Medications as of 07/17/2019  Medication Sig Note  . amLODipine (NORVASC) 5 MG tablet TAKE 1 TABLET(5 MG) BY MOUTH DAILY   . amphetamine-dextroamphetamine (ADDERALL XR) 20 MG 24 hr capsule Take 1 capsule (20 mg total) by mouth every morning.   Marland Kitchen buPROPion (WELLBUTRIN SR) 150 MG 12 hr tablet Take 2 tablets (300 mg total) by mouth daily.   . DULoxetine (CYMBALTA) 30 MG capsule Take 1 capsule (30 mg total) by mouth 2 (two) times daily.   . irbesartan-hydrochlorothiazide (AVALIDE) 150-12.5 MG tablet Take 1 tablet by mouth daily.   Marland Kitchen levonorgestrel (MIRENA) 20 MCG/24HR IUD 1 each by Intrauterine route once. 07/21/2017: PLACED  NOV. 2018  . nystatin (MYCOSTATIN/NYSTOP) powder Apply 1 application  topically 3 (three) times daily.    No facility-administered encounter medications on file as of 07/17/2019.       Information about Care Management services was shared with Ms.  Mis today including:  1. Care Management services include personalized support from  designated clinical staff supervised by her physician, including individualized plan of care and coordination with other care providers 2. Remind patient of 24/7 contact phone numbers to provider's office for assistance with urgent and routine care needs. 3. Care Management services are voluntary and patient may stop at any time .  Patient agreed to services provided today and verbal consent obtained.   Follow Up Plan: LCSW will F/U with patient in 1 week.  Casimer Lanius, [place] / [place]   [phone] 12:20 PM

## 2019-07-24 ENCOUNTER — Ambulatory Visit: Payer: Medicaid Other | Admitting: Licensed Clinical Social Worker

## 2019-07-24 ENCOUNTER — Other Ambulatory Visit: Payer: Self-pay

## 2019-07-24 DIAGNOSIS — Z7189 Other specified counseling: Secondary | ICD-10-CM

## 2019-07-24 DIAGNOSIS — F418 Other specified anxiety disorders: Secondary | ICD-10-CM

## 2019-07-24 NOTE — Patient Instructions (Addendum)
Licensed Clinical Social Worker Visit Information Ms. Cobbler  it was nice speaking with you. Please call me directly if you have questions [phone] Goals we discussed today: connecting with psychiatry and counseling services Goals Addressed            This Visit's Progress   . Connect with psychiatry and counseling   Not on track    [place] (see longitudinal plan of care for additional care plan information) Current Barriers & Progress:  . Patient with Anxiety and Depression acknowledges deficits with connecting to mental health provider for medication management and ongoing counseling.  . Patient needs Support, Education, and Care Coordination in order to meet unmet mental health needs  . Patient has completed assessment with [facility] however they have not contacted her to schedule an appointment. She is considering selecting another agency . Patient called [facility], they require a referral from PCP.  Referral sent to [facility] on 07/17/19 from [facility] office . Patient has also called [facility] with no return call. Clinical Social Work [employer]):  Marland Kitchen Over the next 30 days,  o patient will reduce or manage symptoms of anxiety and depression   o Will connect with psychiatry for ongoing medication needs Interventions:  . Assessed patient's understanding, education and care coordination needs  . Collaborated with appropriate clinical care team members regarding patient needs Kennyth Lose placed referral to Dr. Loni Muse at [facility] . Called referral coordinator from [facility]  [phone]) to see if they received referral. Left voice message.  . Discussed several options for ongoing counseling based on need and insurance. Also sent patient a list of agencies that take her insurance . Other interventions include: Solution-Focused Strategies  Patient Self Care Activities & Deficits:  . Patient is  unable to independently navigate community resource options without care coordination support . Patient will explore other therapy options and is motivated for treatment  . Patient will review list provided and will call one of the agencies discussed today. . Please see past updates related to this goal by clicking on the "Past Updates" button in the selected goal       Materials provided: Yes: therapy resources Ms. Weatherall received Care Management services today:  1. Care Management services include personalized support from designated clinical staff supervised by her physician, including individualized plan of care and coordination with other care providers 2. 24/7 contact [phone] for assistance for urgent and routine care needs. 3. Care Management are voluntary services and be declined at any time by calling the office.  Patient verbally agreed to assistance and services provided by embedded care coordination/care management team today.   Patient verbalizes understanding of instructions provided today.  Follow up plan:  SW will follow up with patient by phone over the next two weeks  Maurine Cane, LCSW

## 2019-07-24 NOTE — Chronic Care Management (AMB) (Signed)
Care Management   Clinical Social Work Follow Up   07/24/2019 Name: Loretta Brown MRN: [id number] DOB: 15-Mar-1981  Referred by: Lyndee Hensen, DO  Reason for referral : Care Coordination (Depression and anxiety)  Loretta Brown is a 39 y.o. year old female who is a primary care patient of Lyndee Hensen, DO.  Reason for follow-up: Phone encounter with patient today for ongoing assessment and brief interventions to assist with connecting patient with psychiatry and ongoing counseling. Assessment:Patient is not making progress towards goal.  States she continues to run into barriers .   Review of patient status, including review of consultants reports, relevant laboratory and other test results, and collaboration with appropriate care team members and the patient's provider was performed as part of comprehensive patient evaluation and provision of care management services.    SDOH (Social Determinants of Health) assessments performed: No   Goals Addressed            This Visit's Progress   . Connect with psychiatry and counseling   Not on track    [place] (see longitudinal plan of care for additional care plan information) Current Barriers & Progress:  . Patient with Anxiety and Depression acknowledges deficits with connecting to mental health provider for medication management and ongoing counseling.  . Patient needs Support, Education, and Care Coordination in order to meet unmet mental health needs  . Patient has completed assessment with [facility] however they have not contacted her to schedule an appointment. She is considering selecting another agency . Patient called [facility], they require a referral from PCP.  Referral sent to [place] on 07/17/19 from [facility] office . Patient has also called [facility] with no return call. Clinical Social Work [employer]):  Marland Kitchen Over the next 30 days,  o patient will reduce or  manage symptoms of anxiety and depression   o Will connect with psychiatry for ongoing medication needs Interventions:  . Assessed patient's understanding, education and care coordination needs  . Collaborated with appropriate clinical care team members regarding patient needs Kennyth Lose placed referral to Dr. Loni Muse at [facility] . Called referral coordinator from [facility]  [phone]) to see if they received referral. Left voice message.  . Discussed several options for ongoing counseling based on need and insurance. Also sent patient a list of agencies that take her insurance . Other interventions include: Solution-Focused Strategies  Patient Self Care Activities & Deficits:  . Patient is unable to independently navigate community resource options without care coordination support . Patient will explore other therapy options and is motivated for treatment  . Patient will review list provided and will call one of the agencies discussed today. . Please see past updates related to this goal by clicking on the "Past Updates" button in the selected goal        Outpatient Encounter Medications as of 07/24/2019  Medication Sig Note  . amLODipine (NORVASC) 5 MG tablet TAKE 1 TABLET(5 MG) BY MOUTH DAILY   . amphetamine-dextroamphetamine (ADDERALL XR) 20 MG 24 hr capsule Take 1 capsule (20 mg total) by mouth every morning.   Marland Kitchen buPROPion (WELLBUTRIN SR) 150 MG 12 hr tablet Take 2 tablets (300 mg total) by mouth daily.   . DULoxetine (CYMBALTA) 30 MG capsule Take 1 capsule (30 mg total) by mouth 2 (two) times daily.   . irbesartan-hydrochlorothiazide (AVALIDE) 150-12.5 MG tablet Take 1 tablet by mouth daily.   Marland Kitchen levonorgestrel (MIRENA)  20 MCG/24HR IUD 1 each by Intrauterine route once. 07/21/2017: PLACED  NOV. 2018  . nystatin (MYCOSTATIN/NYSTOP) powder Apply 1 application topically 3 (three) times daily.    No facility-administered encounter medications on file as of  07/24/2019.   Plan:  1. LCSW will wait for return call from [place], it no return call will call again in 3 days 2. LCSW will F/U with patient in 2 weeks, patient will call office if needed.  Casimer Lanius, [place] / [place]   [phone] 12:03 PM

## 2019-07-26 ENCOUNTER — Ambulatory Visit: Payer: Self-pay | Admitting: Licensed Clinical Social Worker

## 2019-07-26 NOTE — Chronic Care Management (AMB) (Signed)
    Clinical Social Work  Care Management Outreach   07/26/2019 Name: Loretta Brown MRN: [id number] DOB: 07-07-1980  KEYON Brown is a 39 y.o. year old female who is a primary care patient of Lyndee Hensen, DO .   LCSW reached out to [place] today by phone to make sure she had appointment date and time for psychiatry.  The outreach was unsuccessful.  A HIPPA compliant phone message was left for the patient providing contact information and requesting a return call.   Review of patient status, including review of consultants reports, relevant laboratory and other test results, and collaboration with appropriate care team members and the patient's provider was performed as part of comprehensive patient evaluation and provision of care management services.   Goals Addressed            This Visit's Progress   . Connect with psychiatry and counseling   On track    [place] (see longitudinal plan of care for additional care plan information) Current Barriers & Progress:  . Patient with Anxiety and Depression acknowledges deficits with connecting to mental health provider for medication management and ongoing counseling.  . Patient needs Support, Education, and Care Coordination in order to meet unmet mental health needs  . Patient has completed assessment with [facility] however they have not contacted her to schedule an appointment. She is considering selecting another agency . Referral sent to [place] on 07/17/19 from [facility] office.  Intake scheduled May 5th at 1:00 Clinical Social Work Goal(s):  Marland Kitchen Over the next 30 days,  o patient will reduce or manage symptoms of anxiety and depression   o Will connect with psychiatry for ongoing medication needs Interventions:  . Assessed barriers to care . Collaborated with appropriate clinical care team members regarding patient needs Kennyth Lose placed referral to Dr. Loni Muse at [facility] . [place]  [phone]) for update on patient's referral  Patient Self Care Activities & Deficits:  . Patient is unable to independently navigate community resource options without care coordination support . Patient will explore other therapy options and is motivated for treatment  . Please see past updates related to this goal by clicking on the "Past Updates" button in the selected goal      Follow Up Plan: will F/U with patient in 2 weeks  Casimer Lanius, [place] / [place]   [phone] 2:51 PM

## 2019-07-31 DIAGNOSIS — F332 Major depressive disorder, recurrent severe without psychotic features: Secondary | ICD-10-CM | POA: Diagnosis not present

## 2019-07-31 DIAGNOSIS — F411 Generalized anxiety disorder: Secondary | ICD-10-CM | POA: Diagnosis not present

## 2019-08-07 ENCOUNTER — Ambulatory Visit: Payer: Medicaid Other | Admitting: Licensed Clinical Social Worker

## 2019-08-07 DIAGNOSIS — Z7189 Other specified counseling: Secondary | ICD-10-CM

## 2019-08-07 NOTE — Chronic Care Management (AMB) (Signed)
Care Management   Clinical Social Work Follow Up   08/07/2019 Name: Loretta Brown MRN: [id number] DOB: 1980/08/27  Referred by: Lyndee Hensen, DO  Reason for referral : Care Coordination (psych & counseling referral)  Loretta Brown is a 39 y.o. year old female who is a primary care patient of Lyndee Hensen, DO.  Reason for follow-up: Phone encounter with patient today for ongoing assessment and brief interventions to assist with connecting to psychiatry and counseling.  Assessment: Patient continues to experience barriers with getting medication needs met to manage her anxiety and depression.  She is appreciative of support from LCSW while  navigating her care.   Review of patient status, including review of consultants reports, relevant laboratory and other test results, and collaboration with appropriate care team members and the patient's provider was performed as part of comprehensive patient evaluation and provision of care management services.    Advance Directive Status: Not addressed during this encounter. SDOH (Social Determinants of Health) assessments performed: Yes   Goals Addressed            This Visit's Progress   . Connect with psychiatry and counseling   On track    [place] (see longitudinal plan of care for additional care plan information) Current Barriers & Progress:  . Patient with Anxiety and Depression acknowledges deficits with connecting to mental health provider for medication management and ongoing counseling.  . Patient needs Support, Education, and Care Coordination in order to meet unmet mental health needs  . Patient has completed assessment with [facility] however they have not contacted her to schedule an appointment. She has decided to start counseling with [facility]  . Intake completed with [facility] on May 5th at 1:00, they have referred her to [facility] for an assessment. Clinical  Social Work [employer]):  Marland Kitchen Over the next 30 days,  o patient will reduce or manage symptoms of anxiety and depression   o Will connect with psychiatry for ongoing medication and counseling  Interventions:  . Assessed barriers to care and  informed patient to reach out to [facility] to schedule her appointment . Emotional support, solution focused and task centered approaches utilized Patient Self Care Activities & Deficits:  . Patient is unable to independently navigate community resource options without care coordination support . Patient is motivated for treatment and will contact [facility] to schedule her assessment  . Please see past updates related to this goal by clicking on the "Past Updates" button in the selected goal        Outpatient Encounter Medications as of 08/07/2019  Medication Sig Note  . amLODipine (NORVASC) 5 MG tablet TAKE 1 TABLET(5 MG) BY MOUTH DAILY   . amphetamine-dextroamphetamine (ADDERALL XR) 20 MG 24 hr capsule Take 1 capsule (20 mg total) by mouth every morning.   Marland Kitchen buPROPion (WELLBUTRIN SR) 150 MG 12 hr tablet Take 2 tablets (300 mg total) by mouth daily.   . DULoxetine (CYMBALTA) 30 MG capsule Take 1 capsule (30 mg total) by mouth 2 (two) times daily.   . irbesartan-hydrochlorothiazide (AVALIDE) 150-12.5 MG tablet Take 1 tablet by mouth daily.   Marland Kitchen levonorgestrel (MIRENA) 20 MCG/24HR IUD 1 each by Intrauterine route once. 07/21/2017: PLACED  NOV. 2018  . nystatin (MYCOSTATIN/NYSTOP) powder Apply 1 application topically 3 (three) times daily.    No facility-administered encounter medications on file as of 08/07/2019.   Plan: F/U with patient in 2 weeks  Casimer Lanius, LCSW Chronic  [place] / [place]   [phone] 2:19 PM

## 2019-08-07 NOTE — Patient Instructions (Signed)
Licensed Clinical Social Worker Visit Information Loretta Brown  it was nice speaking with you. Please call me directly if you have questions [phone] Goals we discussed today: meeting your mental health needs Goals Addressed            This Visit's Progress   . Connect with psychiatry and counseling   On track    [place] (see longitudinal plan of care for additional care plan information) Current Barriers & Progress:  . Patient with Anxiety and Depression acknowledges deficits with connecting to mental health provider for medication management and ongoing counseling.  . Patient needs Support, Education, and Care Coordination in order to meet unmet mental health needs  . Patient has completed assessment with [facility] however they have not contacted her to schedule an appointment. She has decided to start counseling with [facility]  . Intake completed with [facility] on May 5th at 1:00, they have referred her to [facility] for an assessment. Clinical Social Work [employer]):  Marland Kitchen Over the next 30 days,  o patient will reduce or manage symptoms of anxiety and depression   o Will connect with psychiatry for ongoing medication and counseling  Interventions:  . Assessed barriers to care and  informed patient to reach out to [facility] to schedule her appointment . Emotional support, solution focused and task centered approaches utilized Patient Self Care Activities & Deficits:  . Patient is unable to independently navigate community resource options without care coordination support . Patient is motivated for treatment and will contact [facility] to schedule her assessment  . Please see past updates related to this goal by clicking on the "Past Updates" button in the selected goal       Materials provided: Verbal education about connecting with [facility]  provided by phone Loretta Brown received Care Management services today:  1. Care  Management services include personalized support from designated clinical staff supervised by her physician, including individualized plan of care and coordination with other care providers 2. 24/7 contact [phone] for assistance for urgent and routine care needs. 3. Care Management are voluntary services and be declined at any time by calling the office.  Patient  verbally agreed to assistance and services provided by embedded care coordination/care management team today.   Patient verbalizes understanding of instructions provided today.  Follow up plan:  SW will follow up with patient by phone over the next 2 weeks  Loretta Cane, LCSW

## 2019-08-14 DIAGNOSIS — Z23 Encounter for immunization: Secondary | ICD-10-CM | POA: Diagnosis not present

## 2019-08-19 ENCOUNTER — Other Ambulatory Visit: Payer: Self-pay

## 2019-08-19 ENCOUNTER — Ambulatory Visit: Payer: Medicaid Other

## 2019-08-19 ENCOUNTER — Ambulatory Visit (INDEPENDENT_AMBULATORY_CARE_PROVIDER_SITE_OTHER): Payer: Medicaid Other

## 2019-08-19 DIAGNOSIS — Z111 Encounter for screening for respiratory tuberculosis: Secondary | ICD-10-CM

## 2019-08-19 NOTE — Progress Notes (Signed)
Patient is here for a PPD placement.  It was placed on 08/19/2019 in the left forearm @ 3:00 pm.    Nurse visit scheduled for Thursday, 5/27 at 9 am.    Talbot Grumbling, RN

## 2019-08-22 ENCOUNTER — Other Ambulatory Visit: Payer: Self-pay

## 2019-08-22 ENCOUNTER — Ambulatory Visit (INDEPENDENT_AMBULATORY_CARE_PROVIDER_SITE_OTHER): Payer: Medicaid Other

## 2019-08-22 DIAGNOSIS — Z111 Encounter for screening for respiratory tuberculosis: Secondary | ICD-10-CM

## 2019-08-22 LAB — TB SKIN TEST
Induration: 0 mm
TB Skin Test: NEGATIVE

## 2019-08-22 NOTE — Progress Notes (Signed)
Patient is here for a PPD read.  It was placed on 08/19/2019 in the left forearm @ 1500.    PPD RESULTS:  Result: negative Induration: 0 mm  Letter created and given to patient for documentation purposes. Talbot Grumbling, RN

## 2019-08-23 ENCOUNTER — Ambulatory Visit: Payer: Medicaid Other | Admitting: Licensed Clinical Social Worker

## 2019-08-23 DIAGNOSIS — Z7189 Other specified counseling: Secondary | ICD-10-CM

## 2019-08-23 NOTE — Patient Instructions (Signed)
Licensed Clinical Social Worker Visit Information Ms. Gabel  it was nice speaking with you. Please call me directly if you have questions [phone] Goals we discussed today:  Goals Addressed            This Visit's Progress   . COMPLETED: Connect with psychiatry and counseling   On track    [place] (see longitudinal plan of care for additional care plan information) Current Barriers & Progress:  . Patient with Anxiety and Depression acknowledges deficits with connecting to mental health provider for medication management and ongoing counseling.  . Patient needs Support, Education, and Care Coordination in order to meet unmet mental health needs  . Intake completed with [facility] they have increased her depression medication and referred her to [facility] for an assessment before they will write Rx for controlled substance . Marland Kitchen Patient has first counseling appointment with [facility]  . Appointment has not been scheduled with [facility] Clinical Social Work Goal(s):  Marland Kitchen Over the next 30 days,  o patient will reduce or manage symptoms of anxiety and depression   o Will connect with psychiatry for ongoing medication and counseling  Interventions:  . Assessed barriers to care and  informed patient to reach out to [facility] to schedule her appointment . solution focused and task centered approaches utilized Patient Self Care Activities & Deficits:  . Patient is unable to independently navigate community resource options without care coordination support . Patient is motivated for treatment and will contact [facility] to schedule her assessment  . Please see past updates related to this goal by clicking on the "Past Updates" button in the selected goal        Materials provided: Ms. Heggs received Care Management services today:  1. Care Management services include personalized support from designated clinical staff supervised by her  physician, including individualized plan of care and coordination with other care providers 2. 24/7 contact [phone] for assistance for urgent and routine care needs. 3. Care Management are voluntary services and be declined at any time by calling the office.  Patient verbalizes understanding of instructions provided today.  Follow up plan: No follow up scheduled   Maurine Cane, LCSW

## 2019-08-23 NOTE — Chronic Care Management (AMB) (Signed)
Care Management   Clinical Social Work Follow Up   08/23/2019 Name: Loretta Brown MRN: [id number] DOB: 1980/07/30  Referred by: Lyndee Hensen, DO  Reason for referral : Care Coordination (F/U) Loretta Brown is a 39 y.o. year old female who is a primary care patient of Lyndee Hensen, DO.   Reason for follow-up: Phone encounter with patient today for ongoing assessment and brief interventions to assist with connecting with counseling and psychiatry.  Assessment: Patient is making progress towards goal.  States she is feeling better since the increase in depression medication.  Recommendation: Patient may benefit from, and is in agreement to contact [facility] to have psychological assessment completed  Review of patient status, including review of consultants reports, relevant laboratory and other test results, and collaboration with appropriate care team members and the patient's provider was performed as part of comprehensive patient evaluation and provision of care management services.    SDOH (Social Determinants of Health) assessments performed: No   Goals Addressed            This Visit's Progress   . COMPLETED: Connect with psychiatry and counseling   On track    [place] (see longitudinal plan of care for additional care plan information) Current Barriers & Progress:  . Patient with Anxiety and Depression acknowledges deficits with connecting to mental health provider for medication management and ongoing counseling.  . Patient needs Support, Education, and Care Coordination in order to meet unmet mental health needs  . Intake completed with [facility] they have increased her depression medication and referred her to [facility] for an assessment before they will write Rx for controlled substance . Marland Kitchen Patient has first counseling appointment with [facility]  . Appointment has not been scheduled with [facility]  Social Work Goal(s):  Marland Kitchen Over the next 30 days,  o patient will reduce or manage symptoms of anxiety and depression   o Will connect with psychiatry for ongoing medication and counseling  Interventions:  . Assessed barriers to care and  informed patient to reach out to [facility] to schedule her appointment . solution focused and task centered approaches utilized Patient Self Care Activities & Deficits:  . Patient is unable to independently navigate community resource options without care coordination support . Patient is motivated for treatment and will contact [facility] to schedule her assessment  . Please see past updates related to this goal by clicking on the "Past Updates" button in the selected goal         Outpatient Encounter Medications as of 08/23/2019  Medication Sig Note  . amLODipine (NORVASC) 5 MG tablet TAKE 1 TABLET(5 MG) BY MOUTH DAILY   . amphetamine-dextroamphetamine (ADDERALL XR) 20 MG 24 hr capsule Take 1 capsule (20 mg total) by mouth every morning.   Marland Kitchen buPROPion (WELLBUTRIN SR) 150 MG 12 hr tablet Take 2 tablets (300 mg total) by mouth daily.   . DULoxetine (CYMBALTA) 30 MG capsule Take 1 capsule (30 mg total) by mouth 2 (two) times daily.   . irbesartan-hydrochlorothiazide (AVALIDE) 150-12.5 MG tablet Take 1 tablet by mouth daily.   Marland Kitchen levonorgestrel (MIRENA) 20 MCG/24HR IUD 1 each by Intrauterine route once. 07/21/2017: PLACED  NOV. 2018  . nystatin (MYCOSTATIN/NYSTOP) powder Apply 1 application topically 3 (three) times daily.    No facility-administered encounter medications on file as of 08/23/2019.   Plan:  Patient will contact [facility] office if needed, no follow up scheduled.  Casimer Lanius, LCSW  [place] / [place]   [phone] 11:55 AM

## 2019-09-06 DIAGNOSIS — F332 Major depressive disorder, recurrent severe without psychotic features: Secondary | ICD-10-CM | POA: Diagnosis not present

## 2019-09-06 DIAGNOSIS — F411 Generalized anxiety disorder: Secondary | ICD-10-CM | POA: Diagnosis not present

## 2019-09-17 DIAGNOSIS — F332 Major depressive disorder, recurrent severe without psychotic features: Secondary | ICD-10-CM | POA: Diagnosis not present

## 2019-09-17 DIAGNOSIS — F411 Generalized anxiety disorder: Secondary | ICD-10-CM | POA: Diagnosis not present

## 2019-09-19 ENCOUNTER — Other Ambulatory Visit: Payer: Self-pay

## 2019-09-19 DIAGNOSIS — F988 Other specified behavioral and emotional disorders with onset usually occurring in childhood and adolescence: Secondary | ICD-10-CM

## 2019-09-27 ENCOUNTER — Other Ambulatory Visit: Payer: Self-pay

## 2019-09-27 ENCOUNTER — Ambulatory Visit (INDEPENDENT_AMBULATORY_CARE_PROVIDER_SITE_OTHER): Payer: Medicaid Other | Admitting: Family Medicine

## 2019-09-27 ENCOUNTER — Encounter: Payer: Self-pay | Admitting: Family Medicine

## 2019-09-27 ENCOUNTER — Ambulatory Visit (HOSPITAL_COMMUNITY)
Admission: RE | Admit: 2019-09-27 | Discharge: 2019-09-27 | Disposition: A | Discharge status: Home / Self Care | Payer: Medicaid Other | Source: Ambulatory Visit | Attending: Family Medicine | Admitting: Family Medicine

## 2019-09-27 VITALS — BP 130/86 | HR 106 | Ht 63.0 in | Wt 197.0 lb

## 2019-09-27 DIAGNOSIS — R000 Tachycardia, unspecified: Secondary | ICD-10-CM | POA: Diagnosis not present

## 2019-09-27 DIAGNOSIS — I10 Essential (primary) hypertension: Secondary | ICD-10-CM | POA: Diagnosis not present

## 2019-09-27 DIAGNOSIS — K625 Hemorrhage of anus and rectum: Secondary | ICD-10-CM | POA: Diagnosis not present

## 2019-09-27 DIAGNOSIS — Z1159 Encounter for screening for other viral diseases: Secondary | ICD-10-CM | POA: Diagnosis not present

## 2019-09-27 DIAGNOSIS — R112 Nausea with vomiting, unspecified: Secondary | ICD-10-CM

## 2019-09-27 DIAGNOSIS — F418 Other specified anxiety disorders: Secondary | ICD-10-CM

## 2019-09-27 DIAGNOSIS — R0602 Shortness of breath: Secondary | ICD-10-CM | POA: Insufficient documentation

## 2019-09-27 DIAGNOSIS — F988 Other specified behavioral and emotional disorders with onset usually occurring in childhood and adolescence: Secondary | ICD-10-CM | POA: Diagnosis not present

## 2019-09-27 LAB — HEMOCCULT GUIAC POC 1CARD (OFFICE): Fecal Occult Blood, POC: POSITIVE — AB

## 2019-09-27 NOTE — Assessment & Plan Note (Addendum)
-   obtain CBC  - Fecal occult positive - Anoscopy performed. Patient tolerated procedure well.  Showed no ulcers, nonbleeding external hemorrhoids, no internal hemorrhoids visible -GI referral placed

## 2019-09-27 NOTE — Assessment & Plan Note (Signed)
Stable. Discontinue Irebesartan/HCTZ  - Continue amlodipine

## 2019-09-27 NOTE — Progress Notes (Signed)
SUBJECTIVE:   CHIEF COMPLAINT / HPI:   Loretta Brown is a 39 y.o. female here for medication management but also has complaints of nausea and vomiting and bright red blood per rectum.   Medication management Patient would like refill of Adderall.  States that this is the only thing that helps her anxiety and depression.  Has followed up with Dr. Darleene Cleaver, psychiatrist.  She also has a therapist.  States that her Cymbalta dosage has been increased to 90 mg (60 mg in the morning, 30 mg in the afternoon).  She is to follow-up with a neuropsychiatrist but has not yet followed up.  States that she gets Adderall from her friends.  Wishes she never started Adderall when she was younger as she feels like she goes through withdrawals.  Nausea, vomiting; exertional component Reports nausea and vomiting (dry heaving) after going upstairs for the past 2 weeks. Denies bloody or bilious emesis.  Does not have any abdominal pain and does not report any chest discomfort.  Endorses shortness of breath with exertion.  No recent sick contacts or changes in foods.  Has not had symptoms like this previously.  Feels like symptoms are worse when she gets overheated.  Of note, does have a history of reflux.  Bright red blood per rectum Reports intermittent bright red blood per rectum that changes the color of her bowl.  This has been going on for the past 6 months.  States she has had previous symptoms like this in the past when she was younger, in her 11s.  Reports previous work-up was unremarkable.  Has not had a recent colonoscopy.  Is having burning reflux-like symptoms.  Has had no changes in appetite or decreased p.o. intake.  Does not believe she is pregnant and has an IUD that was placed after her son was born in September 2018.  Has history of hemorrhoids when she was pregnant.  Has not tried anything for this.  Hypertension Patient has been taking amlodipine as prescribed.  Has not been taking  irbesartan-hydrochlorothiazide as she did not pick this medication up from the pharmacy.  Denies vision changes, palpitations, leg swelling and chest discomfort.  PERTINENT  PMH / PSH: History of Adderall misuse, hemorrhoids, GERD, anxiety, depression, hypertension  OBJECTIVE:   BP 130/86   Pulse (!) 106   Ht 5\' 3"  (1.6 m)   Wt 197 lb (89.4 kg)   SpO2 99%   BMI 34.90 kg/m   GEN: Well-nourished, well-developed female in no acute distress  CV: regular rate and rhythm, no murmurs appreciated  RESP: no increased work of breathing, clear to ascultation bilaterally  ABD: Bowel sounds present. Soft, Nontender, Nondistended. Rectal exam: no tenderness noted, external hemorrhoids noted, sphincter tone normal, stool guaiac positive, CMA chaperone present.  Anoscopy performed-no bleeding hemorrhoids or ulcers noted.  Patient tolerated procedure well MSK: no lower extremity edema, range of motion normal grossly SKIN: warm, dry NEURO: grossly normal, moves all extremities appropriately PSYCH: Normal affect, appropriate speech and behavior    ASSESSMENT/PLAN:   Hypertension, essential Stable. Discontinue Irebesartan/HCTZ  - Continue amlodipine     Bright red blood per rectum - obtain CBC  - Fecal occult positive - Anoscopy performed. Patient tolerated procedure well.  Showed no ulcers, nonbleeding external hemorrhoids, no internal hemorrhoids visible -GI referral placed   ADD (attention deficit disorder) Adderall managed by psychiatry.  Defer treatment to psychiatry at this time.  Advised patient to schedule follow-up visit with psychiatrist Dr. Dr.  Mojeed A. Akintayo and [facility].  Per patient, she was referred to neuropsychiatrist.   [facility] will not prescribe this medication going forward.  Per PDMP, it appears this medication was not prescribed in the last month.   Depression with anxiety Follows with Dr. Vevelyn Pat A. Akintayo.  Defer treatment to psychiatry.  Updated patient's  medication list.  SOB (shortness of breath) on exertion EKG obtained with sinus tachycardia.  Heart rate 101.  Normal axis.  No ST elevations or T wave abnormalities.  Etiology of shortness of breath not likely cardiac in nature however this cannot be ruled out as patient not currently short of breath at this time.  Will obtain CBC and BMP to look for anemia and electrolyte abnormalities.  Possibly due to volume depletion versus orthostasis as patient reported nausea and vomiting.  Will give Zofran as needed.     Lyndee Hensen, [place]

## 2019-09-27 NOTE — Patient Instructions (Addendum)
It was great seeing you today!  Please check-out at the front desk before leaving the clinic. I'd like to see you back in 3 months but if you need to be seen earlier than that for any new issues we're happy to fit you in, just give [place] a call!  Visit Remembers: - Stop by the pharmacy to pick up your prescriptions  - Continue to work on your healthy eating habits and incorporating exercise into your daily life.  - Your goal is to have an BP < 120/<80 - Medicine Changes: stop taking irebesartan/HCTZ - To Do: follow up with the neuropsychiatrist  - You will get a call regarding your referral to the (GI) gut doctors regarding your bright red blood per rectum    Regarding lab work today:  Due to recent changes in healthcare laws, you may see the results of your imaging and laboratory studies on MyChart before your provider has had a chance to review them.  I understand that in some cases there may be results that are confusing or concerning to you. Not all laboratory results come back in the same time frame and you may be waiting for multiple results in order to interpret others.  Please give [place] 72 hours in order for your provider to thoroughly review all the results before contacting the office for clarification of your results. If everything is normal, you will get a letter in the mail or a message in My Chart. Please give [place] a call if you do not hear from [place] after 2 weeks.  Please bring all of your medications with you to each visit.    If you haven't already, sign up for My Chart to have easy access to your labs results, and communication with your primary care physician.  Feel free to call with any questions or concerns at any time, at [phone].   Take care,  Dr. Rushie [facility]

## 2019-09-28 LAB — CBC
Hematocrit: 41.4 % (ref 34.0–46.6)
Hemoglobin: 14 g/dL (ref 11.1–15.9)
MCH: 32 pg (ref 26.6–33.0)
MCHC: 33.8 g/dL (ref 31.5–35.7)
MCV: 95 fL (ref 79–97)
Platelets: 437 10*3/uL (ref 150–450)
RBC: 4.38 x10E6/uL (ref 3.77–5.28)
RDW: 13.1 % (ref 11.7–15.4)
WBC: 9.1 10*3/uL (ref 3.4–10.8)

## 2019-09-28 LAB — BASIC METABOLIC PANEL
BUN/Creatinine Ratio: 11 (ref 9–23)
BUN: 11 mg/dL (ref 6–24)
CO2: 22 mmol/L (ref 20–29)
Calcium: 9.6 mg/dL (ref 8.7–10.2)
Chloride: 99 mmol/L (ref 96–106)
Creatinine, Ser: 0.99 mg/dL (ref 0.57–1.00)
GFR calc Af Amer: 82 mL/min/{1.73_m2} (ref >59)
GFR calc non Af Amer: 72 mL/min/{1.73_m2} (ref >59)
Glucose: 109 mg/dL — ABNORMAL HIGH (ref 65–99)
Potassium: 3.9 mmol/L (ref 3.5–5.2)
Sodium: 138 mmol/L (ref 134–144)

## 2019-09-28 LAB — HEPATITIS C ANTIBODY: Hep C Virus Ab: <0.1 s/co ratio (ref 0.0–0.9)

## 2019-10-01 ENCOUNTER — Telehealth: Payer: Self-pay

## 2019-10-01 NOTE — Telephone Encounter (Signed)
Patient calls nurse line regarding receiving medication for nausea. Patient states that this was discussed at her last visit but she has not received any medications from pharmacy. Please send to the [facility] on [address].    Forwarding to PCP  Talbot Grumbling, RN

## 2019-10-03 MED ORDER — DULOXETINE HCL 30 MG PO CPEP: ORAL_CAPSULE | ORAL | 2 refills | Status: AC

## 2019-10-03 MED ORDER — ONDANSETRON HCL 4 MG PO TABS: 4.0000 mg | ORAL_TABLET | Freq: Three times a day (TID) | ORAL | 0 refills | Status: AC | PRN

## 2019-10-03 NOTE — Assessment & Plan Note (Signed)
EKG obtained with sinus tachycardia.  Heart rate 101.  Normal axis.  No ST elevations or T wave abnormalities.  Etiology of shortness of breath not likely cardiac in nature however this cannot be ruled out as patient not currently short of breath at this time.  Will obtain CBC and BMP to look for anemia and electrolyte abnormalities.  Possibly due to volume depletion versus orthostasis as patient reported nausea and vomiting.  Will give Zofran as needed.

## 2019-10-03 NOTE — Assessment & Plan Note (Signed)
Follows with Dr. Vevelyn Pat A. Akintayo.  Defer treatment to psychiatry.  Updated patient's medication list.

## 2019-10-03 NOTE — Assessment & Plan Note (Addendum)
Adderall managed by psychiatry.  Defer treatment to psychiatry at this time.  Advised patient to schedule follow-up visit with psychiatrist Dr. Dr. Vevelyn Pat A. Akintayo and [facility].  Per patient, she was referred to neuropsychiatrist.   [facility] will not prescribe this medication going forward.  Per PDMP, it appears this medication was not prescribed in the last month.

## 2019-10-03 NOTE — Progress Notes (Signed)
BMP grossly unremarkable.  No anemia seen on CBC.  Hep C antibodies negative.  UDS results pending.

## 2019-10-04 LAB — SPECIMEN STATUS REPORT

## 2019-10-06 LAB — TOXASSURE SELECT 13 (MW), URINE

## 2019-10-21 DIAGNOSIS — F332 Major depressive disorder, recurrent severe without psychotic features: Secondary | ICD-10-CM | POA: Diagnosis not present

## 2019-10-21 DIAGNOSIS — F411 Generalized anxiety disorder: Secondary | ICD-10-CM | POA: Diagnosis not present

## 2019-10-23 DIAGNOSIS — F332 Major depressive disorder, recurrent severe without psychotic features: Secondary | ICD-10-CM | POA: Diagnosis not present

## 2019-10-23 DIAGNOSIS — F411 Generalized anxiety disorder: Secondary | ICD-10-CM | POA: Diagnosis not present

## 2019-11-13 DIAGNOSIS — F332 Major depressive disorder, recurrent severe without psychotic features: Secondary | ICD-10-CM | POA: Diagnosis not present

## 2019-11-13 DIAGNOSIS — F411 Generalized anxiety disorder: Secondary | ICD-10-CM | POA: Diagnosis not present

## 2019-11-26 DIAGNOSIS — F902 Attention-deficit hyperactivity disorder, combined type: Secondary | ICD-10-CM | POA: Diagnosis not present

## 2019-11-27 DIAGNOSIS — F332 Major depressive disorder, recurrent severe without psychotic features: Secondary | ICD-10-CM | POA: Diagnosis not present

## 2019-11-27 DIAGNOSIS — F411 Generalized anxiety disorder: Secondary | ICD-10-CM | POA: Diagnosis not present

## 2019-12-16 DIAGNOSIS — F902 Attention-deficit hyperactivity disorder, combined type: Secondary | ICD-10-CM | POA: Diagnosis not present

## 2019-12-18 DIAGNOSIS — F332 Major depressive disorder, recurrent severe without psychotic features: Secondary | ICD-10-CM | POA: Diagnosis not present

## 2019-12-18 DIAGNOSIS — F411 Generalized anxiety disorder: Secondary | ICD-10-CM | POA: Diagnosis not present

## 2019-12-25 DIAGNOSIS — F902 Attention-deficit hyperactivity disorder, combined type: Secondary | ICD-10-CM | POA: Diagnosis not present

## 2020-01-01 DIAGNOSIS — F902 Attention-deficit hyperactivity disorder, combined type: Secondary | ICD-10-CM | POA: Diagnosis not present

## 2020-01-08 DIAGNOSIS — F902 Attention-deficit hyperactivity disorder, combined type: Secondary | ICD-10-CM | POA: Diagnosis not present

## 2020-01-15 DIAGNOSIS — F332 Major depressive disorder, recurrent severe without psychotic features: Secondary | ICD-10-CM | POA: Diagnosis not present

## 2020-01-15 DIAGNOSIS — F411 Generalized anxiety disorder: Secondary | ICD-10-CM | POA: Diagnosis not present

## 2020-01-15 DIAGNOSIS — F902 Attention-deficit hyperactivity disorder, combined type: Secondary | ICD-10-CM | POA: Diagnosis not present

## 2020-01-22 DIAGNOSIS — F902 Attention-deficit hyperactivity disorder, combined type: Secondary | ICD-10-CM | POA: Diagnosis not present

## 2020-01-29 DIAGNOSIS — F902 Attention-deficit hyperactivity disorder, combined type: Secondary | ICD-10-CM | POA: Diagnosis not present

## 2020-02-12 DIAGNOSIS — F902 Attention-deficit hyperactivity disorder, combined type: Secondary | ICD-10-CM | POA: Diagnosis not present

## 2020-03-10 ENCOUNTER — Other Ambulatory Visit: Payer: Self-pay | Admitting: Family Medicine

## 2020-03-10 DIAGNOSIS — I10 Essential (primary) hypertension: Secondary | ICD-10-CM

## 2020-03-11 DIAGNOSIS — F411 Generalized anxiety disorder: Secondary | ICD-10-CM | POA: Diagnosis not present

## 2020-03-11 DIAGNOSIS — F902 Attention-deficit hyperactivity disorder, combined type: Secondary | ICD-10-CM | POA: Diagnosis not present

## 2020-03-11 DIAGNOSIS — F332 Major depressive disorder, recurrent severe without psychotic features: Secondary | ICD-10-CM | POA: Diagnosis not present

## 2020-03-17 ENCOUNTER — Other Ambulatory Visit: Payer: Self-pay | Admitting: Family Medicine

## 2020-03-17 DIAGNOSIS — I10 Essential (primary) hypertension: Secondary | ICD-10-CM

## 2020-04-01 DIAGNOSIS — F902 Attention-deficit hyperactivity disorder, combined type: Secondary | ICD-10-CM | POA: Diagnosis not present

## 2020-05-13 DIAGNOSIS — F332 Major depressive disorder, recurrent severe without psychotic features: Secondary | ICD-10-CM | POA: Diagnosis not present

## 2020-05-13 DIAGNOSIS — F411 Generalized anxiety disorder: Secondary | ICD-10-CM | POA: Diagnosis not present

## 2020-05-27 DIAGNOSIS — F902 Attention-deficit hyperactivity disorder, combined type: Secondary | ICD-10-CM | POA: Diagnosis not present

## 2020-06-10 DIAGNOSIS — F4310 Post-traumatic stress disorder, unspecified: Secondary | ICD-10-CM | POA: Diagnosis not present

## 2020-06-18 ENCOUNTER — Ambulatory Visit: Payer: Medicaid Other | Admitting: Family Medicine

## 2020-06-18 ENCOUNTER — Encounter: Payer: Self-pay | Admitting: Family Medicine

## 2020-06-18 ENCOUNTER — Other Ambulatory Visit: Payer: Self-pay

## 2020-06-18 DIAGNOSIS — G43919 Migraine, unspecified, intractable, without status migrainosus: Secondary | ICD-10-CM | POA: Insufficient documentation

## 2020-06-18 DIAGNOSIS — G43911 Migraine, unspecified, intractable, with status migrainosus: Secondary | ICD-10-CM

## 2020-06-18 MED ORDER — KETOROLAC TROMETHAMINE 60 MG/2ML IM SOLN
60.0000 mg | Freq: Once | INTRAMUSCULAR | Status: AC
  Administered 2020-06-18: 60 mg via INTRAMUSCULAR

## 2020-06-18 MED ORDER — IBUPROFEN 600 MG PO TABS: 600.0000 mg | ORAL_TABLET | Freq: Four times a day (QID) | ORAL | 1 refills | Status: AC | PRN

## 2020-06-18 NOTE — Progress Notes (Signed)
    SUBJECTIVE:   CHIEF COMPLAINT / HPI: Headache  Loretta Brown presents today with a six day history of a continuous, pulsating headache behind her left eye, over the left fronto-parietal area, and left occipital area. This headache presented this past Saturday while taking her child to a birthday party at [facility]. Pain has responded to 600mg  Ibuprofen once a day, with temporary relief bringing her pain to a 3 from a 9. She had one episode of emesis last night. Describes headaches as worse at night and radiating into the back of her neck on the left side. She denies watery eyes, runny nose,any visual changes, temporal involvement, subjective fevers, or unexpected weight loss. She notes that she has remained very hydrated but has had a poor appetite. She has had three migraines in her life, with the last one being more than 10 years ago.  PERTINENT  PMH / PSH:  Migraines Hypertension Depression with anxiety  OBJECTIVE:   BP 122/84   Pulse (!) 110   Ht 5\' 3"  (1.6 m)   Wt 199 lb 12.8 oz (90.6 kg)   SpO2 99%   BMI 35.39 kg/m   Physical Exam Constitutional:      General: She is not in acute distress.    Appearance: Normal appearance. She is not ill-appearing, toxic-appearing or diaphoretic.  HENT:     Head: Normocephalic and atraumatic.     Mouth/Throat:     Mouth: Mucous membranes are moist.  Cardiovascular:     Rate and Rhythm: Normal rate and regular rhythm.     Pulses: Normal pulses.     Heart sounds: Normal heart sounds.  Pulmonary:     Effort: Pulmonary effort is normal.     Breath sounds: Normal breath sounds.  Skin:    General: Skin is warm and dry.  Neurological:     General: No focal deficit present.     Mental Status: She is alert and oriented to person, place, and time.  Psychiatric:        Mood and Affect: Mood normal.        Behavior: Behavior normal.      ASSESSMENT/PLAN:   Headache, migraine, intractable Current headache appears to be a migraine  as it is continuous, has lasted several days, is pulsatile, was associated with emesis, and this pt has had a history of migraines. Reassured against temporal arteritis given absence of pain over temporal area and lack of any changes in vision. No fevers, unexpected weight loss, or focal neurologic symptoms reassuring against more concerning causes of secondary headache.  Plan: Toradol injection today in clinic 600mg  Ibuprofen 3-4 times a day over the next few days, with food on stomach. If not improved by Monday, pt will call our office back for further evaluation     [place]

## 2020-06-18 NOTE — Patient Instructions (Signed)
You will get a shot of super ibuprofen (toradol/ketoralac)  You can take your next ibuprofen 6 hours after this shot. I prescribe 600 mg ibuprofen which you can take one every 6 hours. If you have the 800 mg tabs at home, you can take one every 8 hours.   Best to take with something on your stomach.

## 2020-06-18 NOTE — Progress Notes (Signed)
Patient with a remote hx of migraine but none for at least 10 years.  3 day hx of unrelenting headache, unilateral and accompanied by photophobia and phonophobia.  Some nausea.   Wants to return to work.  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   Does not want anything sedating.  Had some old prescription ibuprofen at home which is working [employer].  Unclear of the dose.  She has only been taking one pill per day.

## 2020-06-18 NOTE — Assessment & Plan Note (Signed)
Current headache appears to be a migraine as it is continuous, has lasted several days, is pulsatile, was associated with emesis, and this pt has had a history of migraines. Reassured against temporal arteritis given absence of pain over temporal area and lack of any changes in vision. No fevers, unexpected weight loss, or focal neurologic symptoms reassuring against more concerning causes of secondary headache.  Plan: Toradol injection today in clinic 600mg  Ibuprofen 3-4 times a day over the next few days, with food on stomach. If not improved by Monday, pt will call our office back for further evaluation

## 2020-06-24 DIAGNOSIS — F4310 Post-traumatic stress disorder, unspecified: Secondary | ICD-10-CM | POA: Diagnosis not present

## 2020-06-26 ENCOUNTER — Other Ambulatory Visit: Payer: Self-pay | Admitting: Family Medicine

## 2020-06-26 DIAGNOSIS — I10 Essential (primary) hypertension: Secondary | ICD-10-CM

## 2020-07-08 DIAGNOSIS — F332 Major depressive disorder, recurrent severe without psychotic features: Secondary | ICD-10-CM | POA: Diagnosis not present

## 2020-07-08 DIAGNOSIS — F411 Generalized anxiety disorder: Secondary | ICD-10-CM | POA: Diagnosis not present

## 2020-07-09 ENCOUNTER — Other Ambulatory Visit: Payer: Self-pay | Admitting: Family Medicine

## 2020-07-09 DIAGNOSIS — B372 Candidiasis of skin and nail: Secondary | ICD-10-CM

## 2020-07-13 NOTE — Telephone Encounter (Signed)
Spoke with patient to make an appt to be seen for check and med refills. Pt stated that she has just started a new job and didn't know when she would be able to take off next. I informed her that she will not be able to have refills done until she is seen by the doctor. Pt understood and said that she will call and make an appt when she is able. Salvatore Marvel, CMA

## 2020-07-15 DIAGNOSIS — F902 Attention-deficit hyperactivity disorder, combined type: Secondary | ICD-10-CM | POA: Diagnosis not present

## 2020-07-29 DIAGNOSIS — F902 Attention-deficit hyperactivity disorder, combined type: Secondary | ICD-10-CM | POA: Diagnosis not present

## 2020-09-04 DIAGNOSIS — F411 Generalized anxiety disorder: Secondary | ICD-10-CM | POA: Diagnosis not present

## 2020-09-04 DIAGNOSIS — F332 Major depressive disorder, recurrent severe without psychotic features: Secondary | ICD-10-CM | POA: Diagnosis not present

## 2021-01-05 DIAGNOSIS — F411 Generalized anxiety disorder: Secondary | ICD-10-CM | POA: Diagnosis not present

## 2021-01-05 DIAGNOSIS — F900 Attention-deficit hyperactivity disorder, predominantly inattentive type: Secondary | ICD-10-CM | POA: Diagnosis not present

## 2021-01-05 DIAGNOSIS — F332 Major depressive disorder, recurrent severe without psychotic features: Secondary | ICD-10-CM | POA: Diagnosis not present

## 2021-01-05 DIAGNOSIS — F4310 Post-traumatic stress disorder, unspecified: Secondary | ICD-10-CM | POA: Diagnosis not present

## 2021-03-05 ENCOUNTER — Other Ambulatory Visit: Payer: Self-pay | Admitting: Family Medicine

## 2021-03-05 DIAGNOSIS — I10 Essential (primary) hypertension: Secondary | ICD-10-CM

## 2021-03-10 DIAGNOSIS — F411 Generalized anxiety disorder: Secondary | ICD-10-CM | POA: Diagnosis not present

## 2021-03-10 DIAGNOSIS — F332 Major depressive disorder, recurrent severe without psychotic features: Secondary | ICD-10-CM | POA: Diagnosis not present

## 2021-03-10 DIAGNOSIS — F900 Attention-deficit hyperactivity disorder, predominantly inattentive type: Secondary | ICD-10-CM | POA: Diagnosis not present

## 2021-03-10 DIAGNOSIS — F4310 Post-traumatic stress disorder, unspecified: Secondary | ICD-10-CM | POA: Diagnosis not present

## 2021-05-26 DIAGNOSIS — F332 Major depressive disorder, recurrent severe without psychotic features: Secondary | ICD-10-CM | POA: Diagnosis not present

## 2021-05-26 DIAGNOSIS — F900 Attention-deficit hyperactivity disorder, predominantly inattentive type: Secondary | ICD-10-CM | POA: Diagnosis not present

## 2021-05-26 DIAGNOSIS — F4310 Post-traumatic stress disorder, unspecified: Secondary | ICD-10-CM | POA: Diagnosis not present

## 2021-05-26 DIAGNOSIS — F411 Generalized anxiety disorder: Secondary | ICD-10-CM | POA: Diagnosis not present

## 2021-06-26 ENCOUNTER — Other Ambulatory Visit: Payer: Self-pay | Admitting: Family Medicine

## 2021-06-26 DIAGNOSIS — I10 Essential (primary) hypertension: Secondary | ICD-10-CM

## 2021-07-20 DIAGNOSIS — F900 Attention-deficit hyperactivity disorder, predominantly inattentive type: Secondary | ICD-10-CM | POA: Diagnosis not present

## 2021-07-20 DIAGNOSIS — F332 Major depressive disorder, recurrent severe without psychotic features: Secondary | ICD-10-CM | POA: Diagnosis not present

## 2021-07-20 DIAGNOSIS — F411 Generalized anxiety disorder: Secondary | ICD-10-CM | POA: Diagnosis not present

## 2021-07-20 DIAGNOSIS — F4310 Post-traumatic stress disorder, unspecified: Secondary | ICD-10-CM | POA: Diagnosis not present

## 2021-07-28 ENCOUNTER — Ambulatory Visit: Payer: Medicaid Other | Admitting: Family Medicine

## 2021-07-28 ENCOUNTER — Ambulatory Visit
Admission: RE | Admit: 2021-07-28 | Discharge: 2021-07-28 | Disposition: A | Discharge status: Home / Self Care | Payer: Medicaid Other | Source: Ambulatory Visit | Attending: Family Medicine | Admitting: Family Medicine

## 2021-07-28 ENCOUNTER — Encounter: Payer: Self-pay | Admitting: Family Medicine

## 2021-07-28 VITALS — BP 140/98 | HR 100 | Ht 63.0 in | Wt 209.0 lb

## 2021-07-28 DIAGNOSIS — F418 Other specified anxiety disorders: Secondary | ICD-10-CM

## 2021-07-28 DIAGNOSIS — E785 Hyperlipidemia, unspecified: Secondary | ICD-10-CM

## 2021-07-28 DIAGNOSIS — I10 Essential (primary) hypertension: Secondary | ICD-10-CM

## 2021-07-28 DIAGNOSIS — S6992XA Unspecified injury of left wrist, hand and finger(s), initial encounter: Secondary | ICD-10-CM

## 2021-07-28 DIAGNOSIS — B372 Candidiasis of skin and nail: Secondary | ICD-10-CM

## 2021-07-28 DIAGNOSIS — M7989 Other specified soft tissue disorders: Secondary | ICD-10-CM | POA: Diagnosis not present

## 2021-07-28 MED ORDER — AMLODIPINE-OLMESARTAN 5-20 MG PO TABS: 1.0000 | ORAL_TABLET | Freq: Every day | ORAL | 0 refills | Status: DC

## 2021-07-28 MED ORDER — NYSTATIN 100000 UNIT/GM EX POWD: 1.0000 "application " | Freq: Three times a day (TID) | CUTANEOUS | 0 refills | Status: DC

## 2021-07-28 NOTE — Progress Notes (Signed)
hand ? ?SUBJECTIVE:  ? ?CHIEF COMPLAINT / HPI:  ? ?Chief Complaint  ?Patient presents with  ? swollen finger  ?  Pinky finger left hand  ? ? ? ?Loretta Brown is a 41 y.o. female here for left pinky pain that started about 3 weeks ago after she rolled off the bed.  Pain has been persistent. Today is rated 2 out of 10.  States that when her finger was first injured it was black and blue and swollen.  She buddy taped it to another finger but this helped.  She is right-hand dominant. ? ?Pt reports red itchy areas under her breast.  ? ? ?PERTINENT  PMH / PSH: reviewed and updated as appropriate  ? ?OBJECTIVE:  ? ?BP (!) 140/98   Pulse 100   Ht '5\' 3"'$  (1.6 m)   Wt 209 lb (94.8 kg)   SpO2 100%   BMI 37.02 kg/m?   ? ?GEN: pleasant well appearing female, in no acute distress  ?CV: regular rate and rhythm, no murmurs appreciated  ?RESP: no increased work of breathing, clear to ascultation bilaterally ?BREAST: Erythematous patches with satellite lesions underneath bilateral breast, nontender to palpation, no fluctuance, no induration ?MSK: Left hand: Metacarpals 1-4 nontender and no bony deformity, left fifth metacarpal, no edema and deformity at PIP joint, mild tenderness to palpation, no overlying skin changes, good range of motion; no lower extremity edema ?SKIN: warm, dry, see breast exam above ?PSYCH: Normal affect, appropriate speech and behavior  ? ? ?ASSESSMENT/PLAN:  ? ? ?Left hand pain ?Patient with injury to her left fifth digit about 3 weeks ago.  On exam she has tenderness and edema at the DIP joint.  Obtain x-ray to evaluate for fracture, avulsion or dislocation.  Advised over-the-counter analgesics as needed for pain. ? ?Candidal skin infection ?Active infection today under her breast.  Refill nystatin powder.  ? ?Hypertension, essential ?BP not at goal.  Initial BP 140/98 after 5 to 10 minutes of rechecking systolic blood pressure increased to 152.  Reports taking amlodipine regularly.  We will start  combination amlodipine olmesartan '5mg'$ -'20mg'$ .  CMP today.  Repeat BMP in 2 weeks. ? ?Hyperlipidemia ?Lipid panel today. ?  ? ? ?Lyndee Hensen, DO ?PGY-3, [facility] ?07/30/2021  ? ? ? ? ? ? ? ? ?

## 2021-07-28 NOTE — Patient Instructions (Addendum)
We changed your blood pressure medications today.  Stop by the pharmacy to pick up your prescriptions.   ? ?Stop by [facility] to get your xray:  I'll call you with the next steps after I get the report  ? ?A ?[facility] ?[place] [facility] ? [phone] ?Open ? Closes 5:30PM ? ?B ?[facility] ?[place] ? [phone] ?Open ? Closes 5PM ? ? ?Regarding lab work today:  ?Due to recent changes in healthcare laws, you may see the results of your imaging and laboratory studies on MyChart before your provider has had a chance to review them.  I understand that in some cases there may be results that are confusing or concerning to you. Not all laboratory results come back in the same time frame and you may be waiting for multiple results in order to interpret others.  Please give [place] 72 hours in order for your provider to thoroughly review all the results before contacting the office for clarification of your results. If everything is normal, you will get a letter in the mail or a message in My Chart. Please give [place] a call if you do not hear from [place] after 2 weeks. ? ?Feel free to call with any questions or concerns at any time, at [phone]. ?  ?Take care,  ?Dr. Susa Simmonds ?[place]  ?

## 2021-07-29 LAB — BASIC METABOLIC PANEL
BUN/Creatinine Ratio: 11 (ref 9–23)
BUN: 10 mg/dL (ref 6–24)
CO2: 24 mmol/L (ref 20–29)
Calcium: 9.6 mg/dL (ref 8.7–10.2)
Chloride: 100 mmol/L (ref 96–106)
Creatinine, Ser: 0.91 mg/dL (ref 0.57–1.00)
Glucose: 95 mg/dL (ref 70–99)
Potassium: 3.7 mmol/L (ref 3.5–5.2)
Sodium: 139 mmol/L (ref 134–144)
eGFR: 81 mL/min/{1.73_m2} (ref >59)

## 2021-07-29 LAB — LIPID PANEL
Chol/HDL Ratio: 3.7 ratio (ref 0.0–4.4)
Cholesterol, Total: 224 mg/dL — ABNORMAL HIGH (ref 100–199)
HDL: 60 mg/dL (ref >39)
LDL Chol Calc (NIH): 127 mg/dL — ABNORMAL HIGH (ref 0–99)
Triglycerides: 211 mg/dL — ABNORMAL HIGH (ref 0–149)
VLDL Cholesterol Cal: 37 mg/dL (ref 5–40)

## 2021-07-30 ENCOUNTER — Encounter: Payer: Self-pay | Admitting: Family Medicine

## 2021-07-30 NOTE — Assessment & Plan Note (Signed)
Active infection today under her breast.  Refill nystatin powder.  ?

## 2021-07-30 NOTE — Assessment & Plan Note (Addendum)
BP not at goal.  Initial BP 140/98 after 5 to 10 minutes of rechecking systolic blood pressure increased to 152.  Reports taking amlodipine regularly.  We will start combination amlodipine olmesartan '5mg'$ -'20mg'$ .  CMP today.  Repeat BMP in 2 weeks. ?

## 2021-07-30 NOTE — Assessment & Plan Note (Signed)
Lipid panel today

## 2021-08-17 DIAGNOSIS — F4310 Post-traumatic stress disorder, unspecified: Secondary | ICD-10-CM | POA: Diagnosis not present

## 2021-08-17 DIAGNOSIS — F332 Major depressive disorder, recurrent severe without psychotic features: Secondary | ICD-10-CM | POA: Diagnosis not present

## 2021-08-17 DIAGNOSIS — F900 Attention-deficit hyperactivity disorder, predominantly inattentive type: Secondary | ICD-10-CM | POA: Diagnosis not present

## 2021-08-17 DIAGNOSIS — F411 Generalized anxiety disorder: Secondary | ICD-10-CM | POA: Diagnosis not present

## 2021-08-23 ENCOUNTER — Other Ambulatory Visit: Payer: Self-pay | Admitting: Family Medicine

## 2021-08-23 DIAGNOSIS — I10 Essential (primary) hypertension: Secondary | ICD-10-CM

## 2021-08-31 ENCOUNTER — Other Ambulatory Visit: Payer: Self-pay | Admitting: Family Medicine

## 2021-08-31 DIAGNOSIS — I10 Essential (primary) hypertension: Secondary | ICD-10-CM

## 2021-09-30 DIAGNOSIS — F411 Generalized anxiety disorder: Secondary | ICD-10-CM | POA: Diagnosis not present

## 2021-09-30 DIAGNOSIS — F332 Major depressive disorder, recurrent severe without psychotic features: Secondary | ICD-10-CM | POA: Diagnosis not present

## 2021-10-04 ENCOUNTER — Other Ambulatory Visit: Payer: Self-pay | Admitting: Student

## 2021-10-04 ENCOUNTER — Other Ambulatory Visit: Payer: Self-pay | Admitting: Family Medicine

## 2021-10-04 DIAGNOSIS — I10 Essential (primary) hypertension: Secondary | ICD-10-CM

## 2021-10-21 DIAGNOSIS — F411 Generalized anxiety disorder: Secondary | ICD-10-CM | POA: Diagnosis not present

## 2021-10-21 DIAGNOSIS — F332 Major depressive disorder, recurrent severe without psychotic features: Secondary | ICD-10-CM | POA: Diagnosis not present

## 2021-11-11 DIAGNOSIS — F411 Generalized anxiety disorder: Secondary | ICD-10-CM | POA: Diagnosis not present

## 2021-11-11 DIAGNOSIS — F332 Major depressive disorder, recurrent severe without psychotic features: Secondary | ICD-10-CM | POA: Diagnosis not present

## 2021-11-11 DIAGNOSIS — F900 Attention-deficit hyperactivity disorder, predominantly inattentive type: Secondary | ICD-10-CM | POA: Diagnosis not present

## 2021-11-11 DIAGNOSIS — F4310 Post-traumatic stress disorder, unspecified: Secondary | ICD-10-CM | POA: Diagnosis not present

## 2021-11-17 DIAGNOSIS — F411 Generalized anxiety disorder: Secondary | ICD-10-CM | POA: Diagnosis not present

## 2021-11-17 DIAGNOSIS — F332 Major depressive disorder, recurrent severe without psychotic features: Secondary | ICD-10-CM | POA: Diagnosis not present

## 2021-12-02 DIAGNOSIS — F332 Major depressive disorder, recurrent severe without psychotic features: Secondary | ICD-10-CM | POA: Diagnosis not present

## 2021-12-02 DIAGNOSIS — F411 Generalized anxiety disorder: Secondary | ICD-10-CM | POA: Diagnosis not present

## 2021-12-15 DIAGNOSIS — F411 Generalized anxiety disorder: Secondary | ICD-10-CM | POA: Diagnosis not present

## 2021-12-15 DIAGNOSIS — F332 Major depressive disorder, recurrent severe without psychotic features: Secondary | ICD-10-CM | POA: Diagnosis not present

## 2021-12-29 DIAGNOSIS — F332 Major depressive disorder, recurrent severe without psychotic features: Secondary | ICD-10-CM | POA: Diagnosis not present

## 2021-12-29 DIAGNOSIS — F411 Generalized anxiety disorder: Secondary | ICD-10-CM | POA: Diagnosis not present

## 2022-01-04 ENCOUNTER — Other Ambulatory Visit: Payer: Self-pay | Admitting: Student

## 2022-01-04 DIAGNOSIS — I10 Essential (primary) hypertension: Secondary | ICD-10-CM

## 2022-01-12 DIAGNOSIS — F332 Major depressive disorder, recurrent severe without psychotic features: Secondary | ICD-10-CM | POA: Diagnosis not present

## 2022-01-12 DIAGNOSIS — F411 Generalized anxiety disorder: Secondary | ICD-10-CM | POA: Diagnosis not present

## 2022-01-31 DIAGNOSIS — F411 Generalized anxiety disorder: Secondary | ICD-10-CM | POA: Diagnosis not present

## 2022-01-31 DIAGNOSIS — F332 Major depressive disorder, recurrent severe without psychotic features: Secondary | ICD-10-CM | POA: Diagnosis not present

## 2022-02-07 DIAGNOSIS — F332 Major depressive disorder, recurrent severe without psychotic features: Secondary | ICD-10-CM | POA: Diagnosis not present

## 2022-02-07 DIAGNOSIS — F900 Attention-deficit hyperactivity disorder, predominantly inattentive type: Secondary | ICD-10-CM | POA: Diagnosis not present

## 2022-02-07 DIAGNOSIS — F4310 Post-traumatic stress disorder, unspecified: Secondary | ICD-10-CM | POA: Diagnosis not present

## 2022-02-07 DIAGNOSIS — F411 Generalized anxiety disorder: Secondary | ICD-10-CM | POA: Diagnosis not present

## 2022-04-01 DIAGNOSIS — F332 Major depressive disorder, recurrent severe without psychotic features: Secondary | ICD-10-CM | POA: Diagnosis not present

## 2022-04-01 DIAGNOSIS — F4310 Post-traumatic stress disorder, unspecified: Secondary | ICD-10-CM | POA: Diagnosis not present

## 2022-04-01 DIAGNOSIS — F900 Attention-deficit hyperactivity disorder, predominantly inattentive type: Secondary | ICD-10-CM | POA: Diagnosis not present

## 2022-04-01 DIAGNOSIS — F411 Generalized anxiety disorder: Secondary | ICD-10-CM | POA: Diagnosis not present

## 2022-04-18 DIAGNOSIS — F411 Generalized anxiety disorder: Secondary | ICD-10-CM | POA: Diagnosis not present

## 2022-04-18 DIAGNOSIS — F332 Major depressive disorder, recurrent severe without psychotic features: Secondary | ICD-10-CM | POA: Diagnosis not present

## 2022-04-29 DIAGNOSIS — F900 Attention-deficit hyperactivity disorder, predominantly inattentive type: Secondary | ICD-10-CM | POA: Diagnosis not present

## 2022-04-29 DIAGNOSIS — F332 Major depressive disorder, recurrent severe without psychotic features: Secondary | ICD-10-CM | POA: Diagnosis not present

## 2022-04-29 DIAGNOSIS — F4310 Post-traumatic stress disorder, unspecified: Secondary | ICD-10-CM | POA: Diagnosis not present

## 2022-04-29 DIAGNOSIS — F411 Generalized anxiety disorder: Secondary | ICD-10-CM | POA: Diagnosis not present

## 2022-05-04 DIAGNOSIS — F411 Generalized anxiety disorder: Secondary | ICD-10-CM | POA: Diagnosis not present

## 2022-05-04 DIAGNOSIS — F332 Major depressive disorder, recurrent severe without psychotic features: Secondary | ICD-10-CM | POA: Diagnosis not present

## 2022-05-05 ENCOUNTER — Other Ambulatory Visit: Payer: Self-pay | Admitting: Family Medicine

## 2022-05-05 DIAGNOSIS — I10 Essential (primary) hypertension: Secondary | ICD-10-CM

## 2022-05-06 ENCOUNTER — Other Ambulatory Visit: Payer: Self-pay | Admitting: Student

## 2022-05-06 DIAGNOSIS — I10 Essential (primary) hypertension: Secondary | ICD-10-CM

## 2022-05-09 NOTE — Telephone Encounter (Signed)
Spoke with patient. Made appt for 2/14 at 9:10 for follow up on HTN. Salvatore Marvel, CMA

## 2022-05-11 ENCOUNTER — Ambulatory Visit: Payer: Medicaid Other | Admitting: Student

## 2022-05-11 ENCOUNTER — Encounter: Payer: Self-pay | Admitting: Student

## 2022-05-11 VITALS — BP 128/88 | HR 94 | Ht 64.0 in | Wt 207.2 lb

## 2022-05-11 DIAGNOSIS — I10 Essential (primary) hypertension: Secondary | ICD-10-CM

## 2022-05-11 DIAGNOSIS — Z23 Encounter for immunization: Secondary | ICD-10-CM

## 2022-05-11 DIAGNOSIS — E785 Hyperlipidemia, unspecified: Secondary | ICD-10-CM

## 2022-05-11 DIAGNOSIS — B372 Candidiasis of skin and nail: Secondary | ICD-10-CM

## 2022-05-11 MED ORDER — NYSTATIN 100000 UNIT/GM EX POWD: 1.0000 | Freq: Three times a day (TID) | CUTANEOUS | 0 refills | Status: AC

## 2022-05-11 MED ORDER — AMLODIPINE-OLMESARTAN 5-20 MG PO TABS: 1.0000 | ORAL_TABLET | Freq: Every day | ORAL | 3 refills | Status: DC

## 2022-05-11 NOTE — Assessment & Plan Note (Signed)
BP: 128/88 today. Well controlled. Goal of <140/90. Continue to work on healthy dietary habits and exercise. UTD lipid panel and A1C ordered    Medication regimen: Azor 5-20

## 2022-05-11 NOTE — Progress Notes (Signed)
  SUBJECTIVE:   CHIEF COMPLAINT / HPI:   Hypertension: BP: 128/88 today. Home medications include: Azor 5-20. She endorses taking these medications as prescribed. Does not check blood pressure at home. 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   Most recent creatinine trend:  Lab Results  Component Value Date   CREATININE 0.91 07/28/2021   CREATININE 0.99 09/27/2019   CREATININE 0.98 01/15/2019   Patient has had a BMP in the past 1 year.  Due for pap.   PERTINENT  PMH / PSH:   Past Medical History:  Diagnosis Date   ADD (attention deficit disorder)    Anxiety    Attention deficit disorder    Depression    Hypertension     OBJECTIVE:  BP 128/88   Pulse 94   Ht '5\' 4"'$  (1.626 m)   Wt 207 lb 3.2 oz (94 kg)   SpO2 99%   BMI 35.57 kg/m  Physical Exam  General: Alert and oriented in no apparent distress Heart: Regular rate and rhythm with no murmurs appreciated Lungs: CTA bilaterally, no wheezing Abdomen: Bowel sounds present, no abdominal pain Skin: Warm and dry Extremities: No lower extremity edema   ASSESSMENT/PLAN:  Hyperlipidemia, unspecified hyperlipidemia type -     Lipid panel -     Hemoglobin A1c  Candidal skin infection Assessment & Plan: History of recurrent intertrigo, not currently present but would like refill.   Orders: -     Nystatin; Apply 1 Application topically 3 (three) times daily.  Dispense: 15 g; Refill: 0  Hypertension, essential Assessment & Plan: BP: 128/88 today. Well controlled. Goal of <140/90. Continue to work on healthy dietary habits and exercise. UTD lipid panel and A1C ordered    Medication regimen: Azor 5-20   Orders: -     amLODIPine-Olmesartan; Take 1 tablet by mouth daily.  Dispense: 30 tablet; Refill: 3 -     Basic metabolic panel -     Hemoglobin A1c  Need for immunization against influenza -     Flu Vaccine QUAD 64moIM (Fluarix, Fluzone & Alfiuria Quad PF)  Encounter for immunization -[employer] 2023 Covid-19 Vaccine 139yrand older   Return in about 3 months (around 08/09/2022) for pap. AlErskine EmeryMD 05/11/2022, 10:58 AM PGY-2, [place]

## 2022-05-11 NOTE — Assessment & Plan Note (Signed)
History of recurrent intertrigo, not currently present but would like refill.

## 2022-05-11 NOTE — Patient Instructions (Addendum)
It was great to see you today! Thank you for choosing [facility] for your primary care. Loretta Brown was seen for follow up.  Today we addressed: Continue with regular medications  Schedule pap smear  Let me know if you would like to talk to someone about quitting smoking   If you haven't already, sign up for My Chart to have easy access to your labs results, and communication with your primary care physician.  We are checking some labs today. If they are abnormal, I will call you. If they are normal, I will send you a MyChart message (if it is active) or a letter in the mail. If you do not hear about your labs in the next 2 weeks, please call the office. I recommend that you always bring your medications to each appointment as this makes it easy to ensure you are on the correct medications and helps [place] not miss refills when you need them. Call the clinic at [phone] if your symptoms worsen or you have any concerns.  You should return to our clinic Return in about 3 months (around 08/09/2022) for pap. Please arrive 15 minutes before your appointment to ensure smooth check in process.  We appreciate your efforts in making this happen.  Thank you for allowing me to participate in your care, Erskine Emery, MD 05/11/2022, 9:37 AM PGY-2, [place]

## 2022-05-12 LAB — BASIC METABOLIC PANEL
BUN/Creatinine Ratio: 14 (ref 9–23)
BUN: 13 mg/dL (ref 6–24)
CO2: 22 mmol/L (ref 20–29)
Calcium: 9.7 mg/dL (ref 8.7–10.2)
Chloride: 100 mmol/L (ref 96–106)
Creatinine, Ser: 0.92 mg/dL (ref 0.57–1.00)
Glucose: 115 mg/dL — ABNORMAL HIGH (ref 70–99)
Potassium: 3.8 mmol/L (ref 3.5–5.2)
Sodium: 140 mmol/L (ref 134–144)
eGFR: 80 mL/min/{1.73_m2} (ref >59)

## 2022-05-12 LAB — LIPID PANEL
Chol/HDL Ratio: 3.9 ratio (ref 0.0–4.4)
Cholesterol, Total: 217 mg/dL — ABNORMAL HIGH (ref 100–199)
HDL: 55 mg/dL (ref >39)
LDL Chol Calc (NIH): 132 mg/dL — ABNORMAL HIGH (ref 0–99)
Triglycerides: 170 mg/dL — ABNORMAL HIGH (ref 0–149)
VLDL Cholesterol Cal: 30 mg/dL (ref 5–40)

## 2022-05-12 LAB — HEMOGLOBIN A1C
Est. average glucose Bld gHb Est-mCnc: 128 mg/dL
Hgb A1c MFr Bld: 6.1 % — ABNORMAL HIGH (ref 4.8–5.6)

## 2022-05-14 ENCOUNTER — Encounter: Payer: Self-pay | Admitting: Student

## 2022-06-08 DIAGNOSIS — F332 Major depressive disorder, recurrent severe without psychotic features: Secondary | ICD-10-CM | POA: Diagnosis not present

## 2022-06-08 DIAGNOSIS — F411 Generalized anxiety disorder: Secondary | ICD-10-CM | POA: Diagnosis not present

## 2022-06-28 DIAGNOSIS — F4310 Post-traumatic stress disorder, unspecified: Secondary | ICD-10-CM | POA: Diagnosis not present

## 2022-06-28 DIAGNOSIS — F332 Major depressive disorder, recurrent severe without psychotic features: Secondary | ICD-10-CM | POA: Diagnosis not present

## 2022-06-28 DIAGNOSIS — F411 Generalized anxiety disorder: Secondary | ICD-10-CM | POA: Diagnosis not present

## 2022-06-28 DIAGNOSIS — F900 Attention-deficit hyperactivity disorder, predominantly inattentive type: Secondary | ICD-10-CM | POA: Diagnosis not present

## 2022-07-13 DIAGNOSIS — F332 Major depressive disorder, recurrent severe without psychotic features: Secondary | ICD-10-CM | POA: Diagnosis not present

## 2022-07-13 DIAGNOSIS — F411 Generalized anxiety disorder: Secondary | ICD-10-CM | POA: Diagnosis not present

## 2022-08-10 DIAGNOSIS — F332 Major depressive disorder, recurrent severe without psychotic features: Secondary | ICD-10-CM | POA: Diagnosis not present

## 2022-08-10 DIAGNOSIS — F411 Generalized anxiety disorder: Secondary | ICD-10-CM | POA: Diagnosis not present

## 2022-10-25 ENCOUNTER — Other Ambulatory Visit: Payer: Self-pay | Admitting: Student

## 2022-10-25 DIAGNOSIS — I10 Essential (primary) hypertension: Secondary | ICD-10-CM

## 2023-03-01 ENCOUNTER — Ambulatory Visit: Payer: Medicaid Other | Admitting: Family Medicine

## 2023-03-01 VITALS — BP 130/80 | HR 94 | Temp 98.5°F | Ht 64.0 in | Wt 201.4 lb

## 2023-03-01 DIAGNOSIS — J019 Acute sinusitis, unspecified: Secondary | ICD-10-CM | POA: Diagnosis present

## 2023-03-01 DIAGNOSIS — Z23 Encounter for immunization: Secondary | ICD-10-CM | POA: Diagnosis present

## 2023-03-01 MED ORDER — AMOXICILLIN-POT CLAVULANATE 875-125 MG PO TABS: 1.0000 | ORAL_TABLET | Freq: Two times a day (BID) | ORAL | 0 refills | Status: AC

## 2023-03-01 NOTE — Patient Instructions (Addendum)
I have sent in antibiotics called Augmentin to be taken twice a day for 5 days.  You can also continue to take Mucinex.  Try nasal saline irrigation with a Nettie pot if you would like.  You can also take Tylenol and ibuprofen as needed for pain relief.  Let me know if your symptoms do not improve!

## 2023-03-01 NOTE — Progress Notes (Signed)
    SUBJECTIVE:   CHIEF COMPLAINT / HPI:   Sick symptoms Has had nasal congestion, sinus pressure, ear pain since Monday, 12/2.  Was at a parade with a lot of kids over the weekend.  Has also had drainage from her right eye that has since resolved.  Her ear pain is much better today, but the pain was very severe prior.  She tried to decrease the pressure in her ear by holding her nose and blowing.  She did not notice any drainage.  She has also been placing alcohol in the ear to help with inflammation.  No fevers, bowel changes, urinary changes, shortness of breath, chest pain.  PERTINENT  PMH / PSH: Hypertension, migraines, depression anxiety, ADD, HLD  OBJECTIVE:   BP 130/80   Pulse 94   Temp 98.5 F (36.9 C)   Ht 5\' 4"  (1.626 m)   Wt 201 lb 6.4 oz (91.4 kg)   SpO2 97%   BMI 34.57 kg/m   General: Alert and oriented, in NAD Skin: Warm, dry, and intact without lesions HEENT: NCAT, EOM grossly normal without ocular erythema/drainage, midline nasal septum, right TM normal, left TM with erythema and two punched-out lesions at the 9:00 and one punched-out lesion at the 5 o'clock position without appreciable drainage, no pain with manipulation of pinna, no pain to palpation of sinuses Cardiac: RRR, no m/r/g appreciated Respiratory: CTAB, breathing and speaking comfortably on RA Abdominal: Soft, nontender, nondistended, normoactive bowel sounds Extremities: Moves all extremities grossly equally Neurological: No gross focal deficit Psychiatric: Appropriate mood and affect   ASSESSMENT/PLAN:   Acute sinusitis History and exam suggestive. While symptoms have only been present for 3 days and viral etiology is higher on the differential, presence of concurrent conjunctivitis raises suspicion for nontypeable H. influenzae.  Because of this, in conjunction with likely left TM perforation, will treat with 5-day course of Augmentin.  Recommended continuing symptomatic treatment with Mucinex,  nasal saline irrigation with Nettie pot, Tylenol, and ibuprofen.  Discussed self-limiting nature of TM.  Advised to return if not improving.   Health maintenance Flu shot obtained today at patient's request.  Janeal Holmes, MD [facility]

## 2023-03-04 ENCOUNTER — Other Ambulatory Visit: Payer: Self-pay | Admitting: Student

## 2023-03-04 DIAGNOSIS — I10 Essential (primary) hypertension: Secondary | ICD-10-CM

## 2023-05-15 ENCOUNTER — Other Ambulatory Visit: Payer: Self-pay | Admitting: Student

## 2023-05-15 DIAGNOSIS — I10 Essential (primary) hypertension: Secondary | ICD-10-CM

## 2023-07-10 IMAGING — CR DG HAND COMPLETE 3+V*L*
3 series · 3 of 3 positions shown · non-contrast
Comparison: None Available.

CLINICAL DATA: Left hand swelling and pain at PIP and DIP of 5th
digit x 3 weeks. Jamming injury.

EXAM:
LEFT HAND - COMPLETE 3+ VIEW

[x hand pa left]
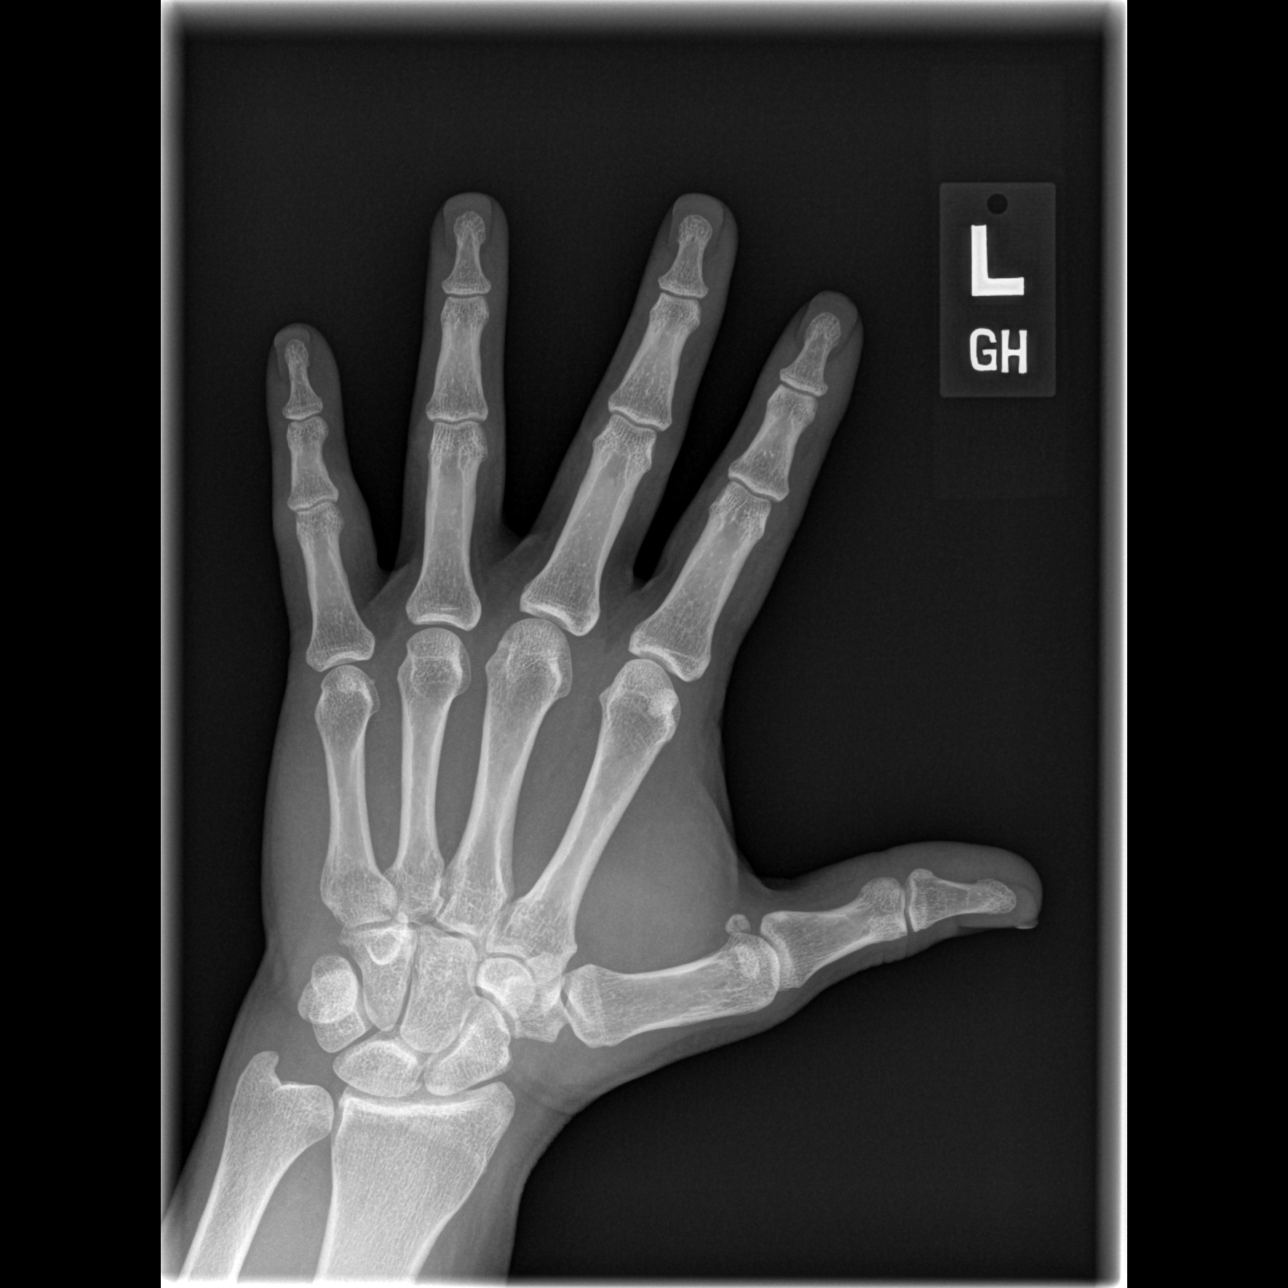

[x hand oblique left]
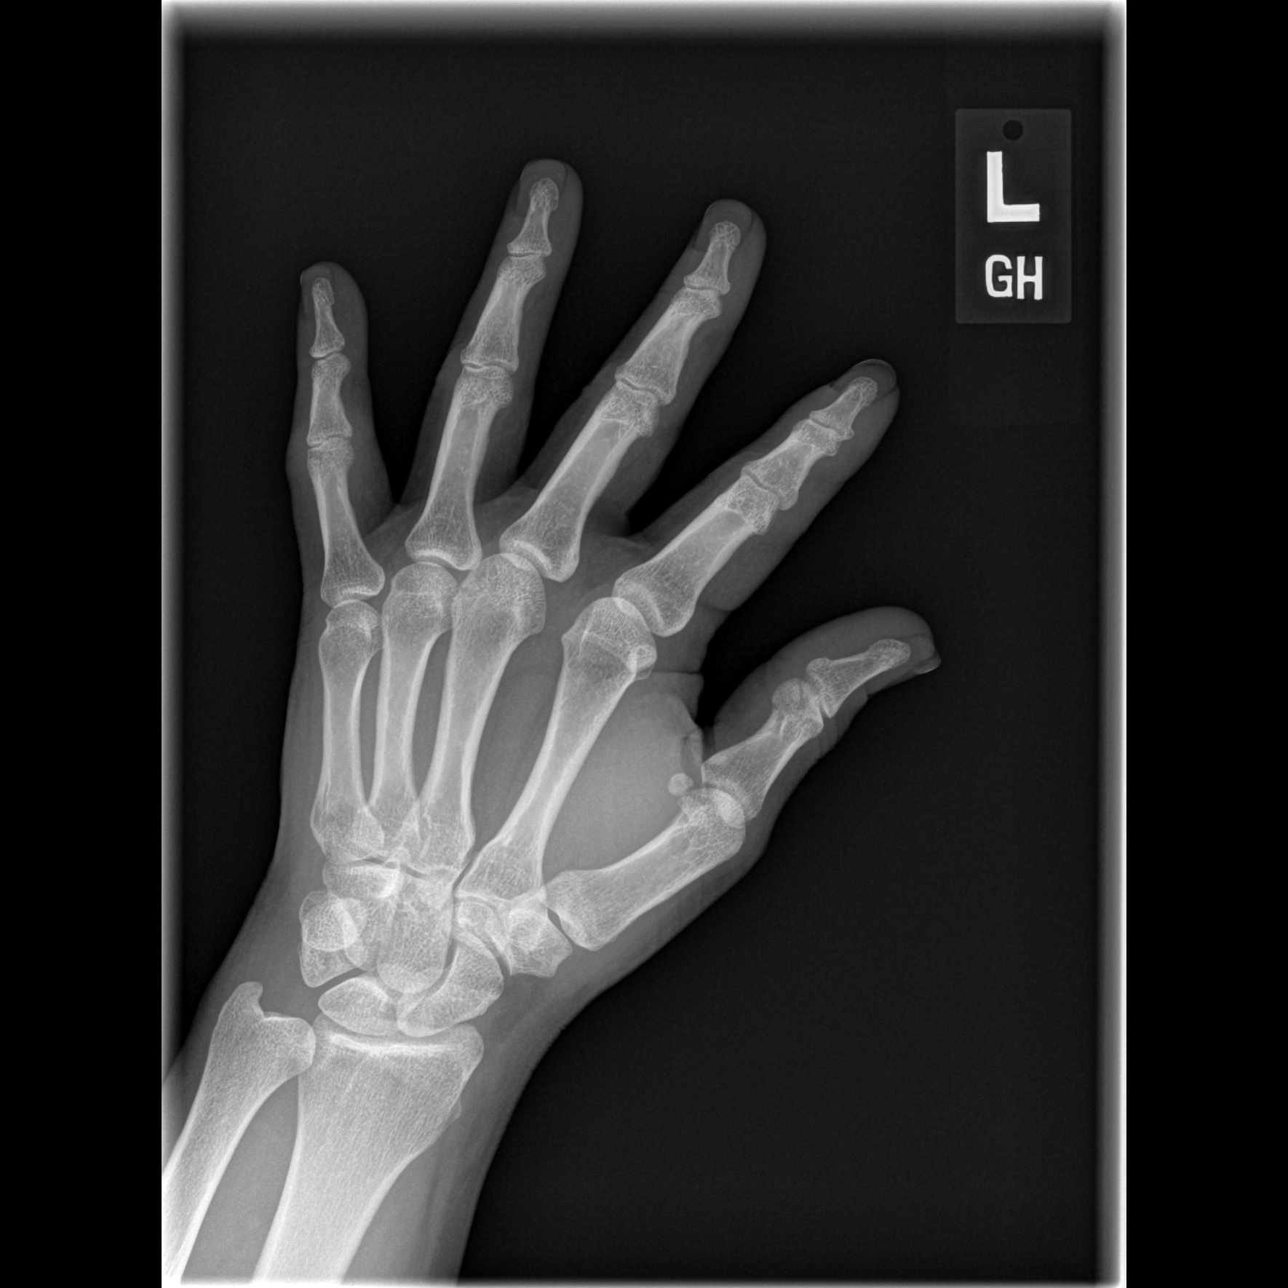

[x hand lat left]
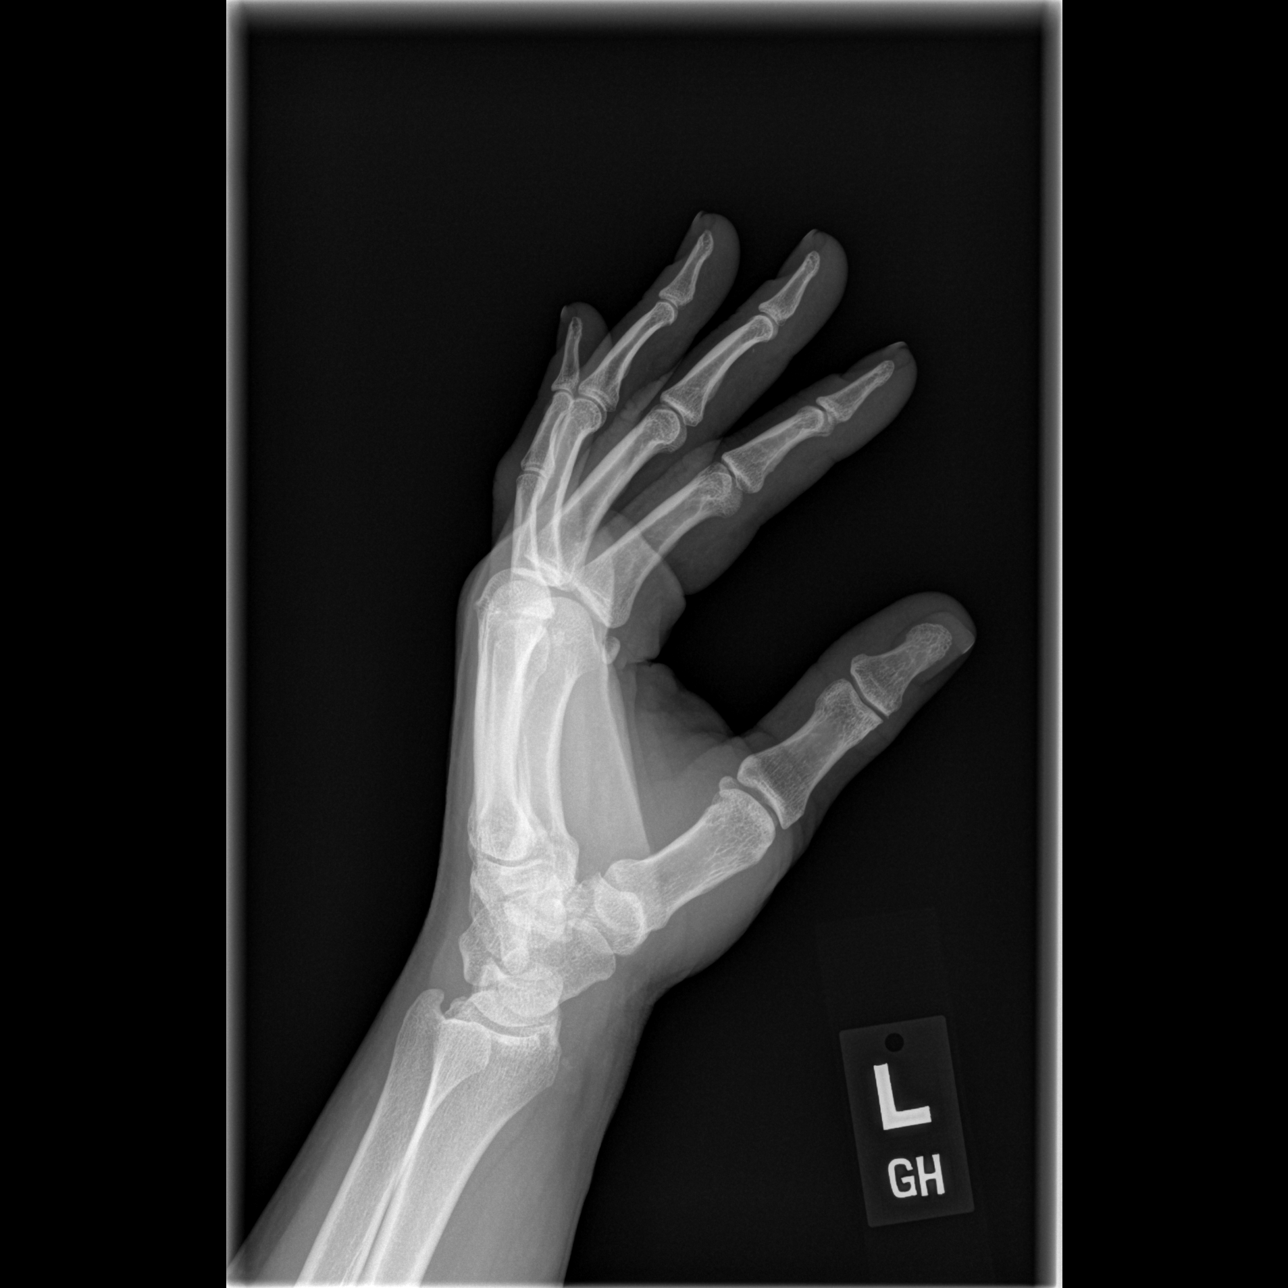

[3 of 3 positions shown; findings below may reference images not displayed]

FINDINGS: Normal bone mineralization. Minimal degenerative spurring at the
trapezium at the first carpometacarpal joint. Joint spaces are
preserved. No acute fracture is seen. No dislocation.
IMPRESSION: No acute fracture. Minimal osteoarthritis of the thumb
carpometacarpal joint.

## 2023-09-19 ENCOUNTER — Other Ambulatory Visit: Payer: Self-pay | Admitting: Student

## 2023-09-19 DIAGNOSIS — I10 Essential (primary) hypertension: Secondary | ICD-10-CM

## 2024-03-18 ENCOUNTER — Inpatient Hospital Stay: Payer: Self-pay

## 2024-03-18 ENCOUNTER — Inpatient Hospital Stay: Payer: Self-pay | Admitting: Hematology and Oncology

## 2024-05-10 ENCOUNTER — Inpatient Hospital Stay: Payer: Self-pay

## 2024-05-10 ENCOUNTER — Inpatient Hospital Stay: Payer: Self-pay | Admitting: Physician Assistant
